# Patient Record
Sex: Female | Born: 1955 | Race: Black or African American | Hispanic: No | Marital: Married | Smoking: Light tobacco smoker
Health system: Southern US, Community
[De-identification: ages and names within clinical notes are randomized; demographics above are authoritative.]

## PROBLEM LIST (undated history)

## (undated) DIAGNOSIS — I4891 Unspecified atrial fibrillation: Secondary | ICD-10-CM

## (undated) DIAGNOSIS — N289 Disorder of kidney and ureter, unspecified: Secondary | ICD-10-CM

## (undated) DIAGNOSIS — I10 Essential (primary) hypertension: Secondary | ICD-10-CM

## (undated) DIAGNOSIS — I509 Heart failure, unspecified: Secondary | ICD-10-CM

## (undated) DIAGNOSIS — Z992 Dependence on renal dialysis: Secondary | ICD-10-CM

## (undated) DIAGNOSIS — I219 Acute myocardial infarction, unspecified: Secondary | ICD-10-CM

## (undated) DIAGNOSIS — I2510 Atherosclerotic heart disease of native coronary artery without angina pectoris: Secondary | ICD-10-CM

## (undated) HISTORY — PX: CHOLECYSTECTOMY: SHX55

---

## 2012-05-25 HISTORY — PX: ABDOMINAL AORTIC ANEURYSM REPAIR: SUR1152

## 2014-11-10 ENCOUNTER — Other Ambulatory Visit: Payer: Self-pay

## 2014-11-10 ENCOUNTER — Inpatient Hospital Stay: Payer: Medicare Other

## 2014-11-10 ENCOUNTER — Emergency Department: Payer: Medicare Other

## 2014-11-10 ENCOUNTER — Inpatient Hospital Stay
Admission: EM | Admit: 2014-11-10 | Discharge: 2014-11-23 | DRG: 853 | Disposition: E | Payer: Medicare Other | Attending: Internal Medicine | Admitting: Internal Medicine

## 2014-11-10 DIAGNOSIS — E162 Hypoglycemia, unspecified: Secondary | ICD-10-CM | POA: Diagnosis present

## 2014-11-10 DIAGNOSIS — Z955 Presence of coronary angioplasty implant and graft: Secondary | ICD-10-CM

## 2014-11-10 DIAGNOSIS — E872 Acidosis, unspecified: Secondary | ICD-10-CM

## 2014-11-10 DIAGNOSIS — D65 Disseminated intravascular coagulation [defibrination syndrome]: Secondary | ICD-10-CM | POA: Diagnosis present

## 2014-11-10 DIAGNOSIS — Z8679 Personal history of other diseases of the circulatory system: Secondary | ICD-10-CM

## 2014-11-10 DIAGNOSIS — I469 Cardiac arrest, cause unspecified: Secondary | ICD-10-CM | POA: Diagnosis present

## 2014-11-10 DIAGNOSIS — J9601 Acute respiratory failure with hypoxia: Secondary | ICD-10-CM | POA: Diagnosis present

## 2014-11-10 DIAGNOSIS — K5660 Unspecified intestinal obstruction: Secondary | ICD-10-CM | POA: Diagnosis present

## 2014-11-10 DIAGNOSIS — I4891 Unspecified atrial fibrillation: Secondary | ICD-10-CM | POA: Diagnosis present

## 2014-11-10 DIAGNOSIS — R748 Abnormal levels of other serum enzymes: Secondary | ICD-10-CM | POA: Diagnosis present

## 2014-11-10 DIAGNOSIS — G4733 Obstructive sleep apnea (adult) (pediatric): Secondary | ICD-10-CM | POA: Diagnosis present

## 2014-11-10 DIAGNOSIS — I252 Old myocardial infarction: Secondary | ICD-10-CM | POA: Diagnosis not present

## 2014-11-10 DIAGNOSIS — Y838 Other surgical procedures as the cause of abnormal reaction of the patient, or of later complication, without mention of misadventure at the time of the procedure: Secondary | ICD-10-CM | POA: Diagnosis present

## 2014-11-10 DIAGNOSIS — R109 Unspecified abdominal pain: Secondary | ICD-10-CM

## 2014-11-10 DIAGNOSIS — R778 Other specified abnormalities of plasma proteins: Secondary | ICD-10-CM

## 2014-11-10 DIAGNOSIS — N186 End stage renal disease: Secondary | ICD-10-CM | POA: Diagnosis not present

## 2014-11-10 DIAGNOSIS — Y658 Other specified misadventures during surgical and medical care: Secondary | ICD-10-CM | POA: Diagnosis present

## 2014-11-10 DIAGNOSIS — R001 Bradycardia, unspecified: Secondary | ICD-10-CM | POA: Diagnosis present

## 2014-11-10 DIAGNOSIS — Z79899 Other long term (current) drug therapy: Secondary | ICD-10-CM

## 2014-11-10 DIAGNOSIS — I9752 Accidental puncture and laceration of a circulatory system organ or structure during other procedure: Secondary | ICD-10-CM | POA: Diagnosis present

## 2014-11-10 DIAGNOSIS — Z9889 Other specified postprocedural states: Secondary | ICD-10-CM | POA: Diagnosis not present

## 2014-11-10 DIAGNOSIS — Z01818 Encounter for other preprocedural examination: Secondary | ICD-10-CM | POA: Diagnosis present

## 2014-11-10 DIAGNOSIS — Z9049 Acquired absence of other specified parts of digestive tract: Secondary | ICD-10-CM | POA: Diagnosis present

## 2014-11-10 DIAGNOSIS — I5031 Acute diastolic (congestive) heart failure: Secondary | ICD-10-CM | POA: Diagnosis present

## 2014-11-10 DIAGNOSIS — Z992 Dependence on renal dialysis: Secondary | ICD-10-CM

## 2014-11-10 DIAGNOSIS — T827XXA Infection and inflammatory reaction due to other cardiac and vascular devices, implants and grafts, initial encounter: Secondary | ICD-10-CM | POA: Diagnosis present

## 2014-11-10 DIAGNOSIS — I509 Heart failure, unspecified: Secondary | ICD-10-CM

## 2014-11-10 DIAGNOSIS — K219 Gastro-esophageal reflux disease without esophagitis: Secondary | ICD-10-CM | POA: Diagnosis present

## 2014-11-10 DIAGNOSIS — J9600 Acute respiratory failure, unspecified whether with hypoxia or hypercapnia: Secondary | ICD-10-CM

## 2014-11-10 DIAGNOSIS — R6521 Severe sepsis with septic shock: Secondary | ICD-10-CM | POA: Diagnosis present

## 2014-11-10 DIAGNOSIS — K921 Melena: Secondary | ICD-10-CM | POA: Diagnosis present

## 2014-11-10 DIAGNOSIS — S1093XA Contusion of unspecified part of neck, initial encounter: Secondary | ICD-10-CM | POA: Diagnosis present

## 2014-11-10 DIAGNOSIS — I120 Hypertensive chronic kidney disease with stage 5 chronic kidney disease or end stage renal disease: Secondary | ICD-10-CM | POA: Diagnosis present

## 2014-11-10 DIAGNOSIS — R7989 Other specified abnormal findings of blood chemistry: Secondary | ICD-10-CM

## 2014-11-10 DIAGNOSIS — Z86010 Personal history of colonic polyps: Secondary | ICD-10-CM

## 2014-11-10 DIAGNOSIS — Z9911 Dependence on respirator [ventilator] status: Secondary | ICD-10-CM

## 2014-11-10 DIAGNOSIS — A419 Sepsis, unspecified organism: Secondary | ICD-10-CM | POA: Diagnosis present

## 2014-11-10 DIAGNOSIS — F17210 Nicotine dependence, cigarettes, uncomplicated: Secondary | ICD-10-CM | POA: Diagnosis present

## 2014-11-10 DIAGNOSIS — N2581 Secondary hyperparathyroidism of renal origin: Secondary | ICD-10-CM | POA: Diagnosis present

## 2014-11-10 DIAGNOSIS — D689 Coagulation defect, unspecified: Secondary | ICD-10-CM | POA: Diagnosis not present

## 2014-11-10 DIAGNOSIS — Z8249 Family history of ischemic heart disease and other diseases of the circulatory system: Secondary | ICD-10-CM

## 2014-11-10 DIAGNOSIS — I2510 Atherosclerotic heart disease of native coronary artery without angina pectoris: Secondary | ICD-10-CM | POA: Diagnosis present

## 2014-11-10 DIAGNOSIS — R740 Nonspecific elevation of levels of transaminase and lactic acid dehydrogenase [LDH]: Secondary | ICD-10-CM | POA: Diagnosis present

## 2014-11-10 DIAGNOSIS — J9690 Respiratory failure, unspecified, unspecified whether with hypoxia or hypercapnia: Secondary | ICD-10-CM

## 2014-11-10 HISTORY — DX: Atherosclerotic heart disease of native coronary artery without angina pectoris: I25.10

## 2014-11-10 HISTORY — DX: Unspecified atrial fibrillation: I48.91

## 2014-11-10 HISTORY — DX: Disorder of kidney and ureter, unspecified: N28.9

## 2014-11-10 HISTORY — DX: Heart failure, unspecified: I50.9

## 2014-11-10 HISTORY — DX: Dependence on renal dialysis: Z99.2

## 2014-11-10 HISTORY — DX: Acute myocardial infarction, unspecified: I21.9

## 2014-11-10 HISTORY — DX: Essential (primary) hypertension: I10

## 2014-11-10 LAB — BASIC METABOLIC PANEL
Anion gap: 25 — ABNORMAL HIGH (ref 5–15)
BUN: 72 mg/dL — ABNORMAL HIGH (ref 6–20)
CO2: 13 mmol/L — ABNORMAL LOW (ref 22–32)
Calcium: 8.3 mg/dL — ABNORMAL LOW (ref 8.9–10.3)
Chloride: 96 mmol/L — ABNORMAL LOW (ref 101–111)
Creatinine, Ser: 6.62 mg/dL — ABNORMAL HIGH (ref 0.44–1.00)
GFR calc Af Amer: 7 mL/min — ABNORMAL LOW (ref >60)
GFR, EST NON AFRICAN AMERICAN: 6 mL/min — AB (ref >60)
Glucose, Bld: 213 mg/dL — ABNORMAL HIGH (ref 65–99)
Potassium: 5.9 mmol/L — ABNORMAL HIGH (ref 3.5–5.1)
Sodium: 134 mmol/L — ABNORMAL LOW (ref 135–145)

## 2014-11-10 LAB — CBC WITH DIFFERENTIAL/PLATELET
Band Neutrophils: 0 % (ref 0–10)
Basophils Absolute: 0 10*3/uL (ref 0.0–0.1)
Basophils Relative: 0 % (ref 0–1)
Blasts: 0 %
Eosinophils Absolute: 0 10*3/uL (ref 0.0–0.7)
Eosinophils Relative: 0 % (ref 0–5)
HCT: 39.5 % (ref 35.0–47.0)
Hemoglobin: 12.2 g/dL (ref 12.0–16.0)
Lymphocytes Relative: 5 % — ABNORMAL LOW (ref 12–46)
Lymphs Abs: 0.4 10*3/uL — ABNORMAL LOW (ref 0.7–4.0)
MCH: 29.5 pg (ref 26.0–34.0)
MCHC: 30.8 g/dL — AB (ref 32.0–36.0)
MCV: 95.8 fL (ref 80.0–100.0)
METAMYELOCYTES PCT: 0 %
MONOS PCT: 5 % (ref 3–12)
MYELOCYTES: 0 %
Monocytes Absolute: 0.4 10*3/uL (ref 0.1–1.0)
NEUTROS PCT: 90 % — AB (ref 43–77)
NRBC: 1 /100{WBCs} — AB
Neutro Abs: 7.5 10*3/uL (ref 1.7–7.7)
Other: 0 %
PLATELETS: 47 10*3/uL — AB (ref 150–400)
PROMYELOCYTES ABS: 0 %
RBC: 4.13 MIL/uL (ref 3.80–5.20)
RDW: 19.5 % — AB (ref 11.5–14.5)
WBC: 8.3 10*3/uL (ref 3.6–11.0)

## 2014-11-10 LAB — HEPATIC FUNCTION PANEL
ALK PHOS: 118 U/L (ref 38–126)
ALT: 61 U/L — ABNORMAL HIGH (ref 14–54)
AST: 145 U/L — AB (ref 15–41)
Albumin: 3.3 g/dL — ABNORMAL LOW (ref 3.5–5.0)
BILIRUBIN TOTAL: 4.2 mg/dL — AB (ref 0.3–1.2)
Bilirubin, Direct: 2.5 mg/dL — ABNORMAL HIGH (ref 0.1–0.5)
Indirect Bilirubin: 1.7 mg/dL — ABNORMAL HIGH (ref 0.3–0.9)
TOTAL PROTEIN: 6 g/dL — AB (ref 6.5–8.1)

## 2014-11-10 LAB — PROTIME-INR
INR: 2.45
Prothrombin Time: 26.7 seconds — ABNORMAL HIGH (ref 11.4–15.0)

## 2014-11-10 LAB — TROPONIN I
TROPONIN I: 0.54 ng/mL — AB (ref <0.031)
Troponin I: 3.31 ng/mL — ABNORMAL HIGH (ref <0.031)

## 2014-11-10 LAB — OCCULT BLOOD X 1 CARD TO LAB, STOOL: FECAL OCCULT BLD: POSITIVE — AB

## 2014-11-10 LAB — RENAL FUNCTION PANEL
Albumin: 2.8 g/dL — ABNORMAL LOW (ref 3.5–5.0)
Anion gap: 25 — ABNORMAL HIGH (ref 5–15)
BUN: 72 mg/dL — ABNORMAL HIGH (ref 6–20)
CALCIUM: 8.4 mg/dL — AB (ref 8.9–10.3)
CO2: 14 mmol/L — ABNORMAL LOW (ref 22–32)
Chloride: 94 mmol/L — ABNORMAL LOW (ref 101–111)
Creatinine, Ser: 6.61 mg/dL — ABNORMAL HIGH (ref 0.44–1.00)
GFR calc Af Amer: 7 mL/min — ABNORMAL LOW (ref >60)
GFR calc non Af Amer: 6 mL/min — ABNORMAL LOW (ref >60)
Glucose, Bld: 212 mg/dL — ABNORMAL HIGH (ref 65–99)
PHOSPHORUS: 12.5 mg/dL — AB (ref 2.5–4.6)
Potassium: 5.9 mmol/L — ABNORMAL HIGH (ref 3.5–5.1)
Sodium: 133 mmol/L — ABNORMAL LOW (ref 135–145)

## 2014-11-10 LAB — GLUCOSE, CAPILLARY
Glucose-Capillary: <10 mg/dL — CL (ref 65–99)
Glucose-Capillary: <10 mg/dL — CL (ref 65–99)
Glucose-Capillary: 101 mg/dL — ABNORMAL HIGH (ref 65–99)

## 2014-11-10 LAB — BRAIN NATRIURETIC PEPTIDE: B Natriuretic Peptide: >4500 pg/mL — ABNORMAL HIGH (ref 0.0–100.0)

## 2014-11-10 LAB — MRSA PCR SCREENING: MRSA BY PCR: NEGATIVE

## 2014-11-10 LAB — MAGNESIUM: Magnesium: 2.3 mg/dL (ref 1.7–2.4)

## 2014-11-10 LAB — APTT: aPTT: 38 seconds — ABNORMAL HIGH (ref 24–36)

## 2014-11-10 LAB — LACTIC ACID, PLASMA: Lactic Acid, Venous: 11.8 mmol/L (ref 0.5–2.0)

## 2014-11-10 MED ORDER — DEXMEDETOMIDINE HCL IN NACL 400 MCG/100ML IV SOLN: 0.4000 ug/kg/h | INTRAVENOUS | Status: DC

## 2014-11-10 MED ORDER — PANTOPRAZOLE SODIUM 40 MG IV SOLR
40.0000 mg | Freq: Two times a day (BID) | INTRAVENOUS | Status: DC
  Administered 2014-11-10: 40 mg via INTRAVENOUS
  Filled 2014-11-10: qty 40

## 2014-11-10 MED ORDER — LIDOCAINE HCL (PF) 1 % IJ SOLN
INTRAMUSCULAR | Status: AC
  Administered 2014-11-10: 5 mL
  Filled 2014-11-10: qty 5

## 2014-11-10 MED ORDER — NOREPINEPHRINE BITARTRATE 1 MG/ML IV SOLN
16.0000 ug/min | Freq: Once | INTRAVENOUS | Status: AC
  Administered 2014-11-10: 30 ug/min via INTRAVENOUS
  Filled 2014-11-10: qty 16

## 2014-11-10 MED ORDER — SODIUM CHLORIDE 0.9 % IV SOLN: Freq: Once | INTRAVENOUS | Status: DC

## 2014-11-10 MED ORDER — SODIUM BICARBONATE 8.4 % IV SOLN
50.0000 meq | Freq: Once | INTRAVENOUS | Status: AC
  Administered 2014-11-10: 50 meq via INTRAVENOUS
  Filled 2014-11-10: qty 50

## 2014-11-10 MED ORDER — DILTIAZEM HCL 25 MG/5ML IV SOLN
INTRAVENOUS | Status: AC
  Administered 2014-11-10: 5 mg
  Filled 2014-11-10: qty 5

## 2014-11-10 MED ORDER — HYDROCORTISONE SOD SUCCINATE 100 MG PF FOR IT USE: 50.0000 mg | Freq: Four times a day (QID) | INTRAMUSCULAR | Status: DC

## 2014-11-10 MED ORDER — PUREFLOW DIALYSIS SOLUTION: INTRAVENOUS | Status: DC

## 2014-11-10 MED ORDER — SODIUM BICARBONATE 4.2 % IV SOLN
50.0000 meq | INTRAVENOUS | Status: DC
  Filled 2014-11-10: qty 100

## 2014-11-10 MED ORDER — VANCOMYCIN HCL IN DEXTROSE 1-5 GM/200ML-% IV SOLN
1000.0000 mg | INTRAVENOUS | Status: DC
  Filled 2014-11-10: qty 200

## 2014-11-10 MED ORDER — IOHEXOL 240 MG/ML SOLN: 25.0000 mL | INTRAMUSCULAR | Status: DC

## 2014-11-10 MED ORDER — VANCOMYCIN HCL IN DEXTROSE 1-5 GM/200ML-% IV SOLN
1000.0000 mg | Freq: Once | INTRAVENOUS | Status: AC
  Administered 2014-11-10: 1000 mg via INTRAVENOUS

## 2014-11-10 MED ORDER — DILTIAZEM HCL 25 MG/5ML IV SOLN: 5.0000 mg | Freq: Once | INTRAVENOUS | Status: DC

## 2014-11-10 MED ORDER — ONDANSETRON HCL 4 MG/2ML IJ SOLN
4.0000 mg | Freq: Once | INTRAMUSCULAR | Status: AC
Start: 2014-11-10 — End: 2014-11-10
  Administered 2014-11-10: 4 mg via INTRAVENOUS

## 2014-11-10 MED ORDER — HEPARIN SODIUM (PORCINE) 1000 UNIT/ML DIALYSIS
1000.0000 [IU] | INTRAMUSCULAR | Status: DC | PRN
  Filled 2014-11-10: qty 6

## 2014-11-10 MED ORDER — VECURONIUM BROMIDE 10 MG IV SOLR
INTRAVENOUS | Status: DC
Start: 2014-11-10 — End: 2014-11-10
  Filled 2014-11-10: qty 10

## 2014-11-10 MED ORDER — MIDAZOLAM HCL 2 MG/2ML IJ SOLN
2.0000 mg | Freq: Once | INTRAMUSCULAR | Status: AC
  Administered 2014-11-10: 2 mg via INTRAVENOUS

## 2014-11-10 MED ORDER — MIDAZOLAM HCL 2 MG/2ML IJ SOLN
INTRAMUSCULAR | Status: AC
  Administered 2014-11-10: 2 mg via INTRAVENOUS
  Filled 2014-11-10: qty 2

## 2014-11-10 MED ORDER — ONDANSETRON HCL 4 MG/2ML IJ SOLN
INTRAMUSCULAR | Status: AC
  Filled 2014-11-10: qty 2

## 2014-11-10 MED ORDER — MIDAZOLAM HCL 5 MG/5ML IJ SOLN
INTRAMUSCULAR | Status: AC
  Filled 2014-11-10: qty 5

## 2014-11-10 MED ORDER — PIPERACILLIN-TAZOBACTAM 3.375 G IVPB: 3.3750 g | Freq: Once | INTRAVENOUS | Status: DC

## 2014-11-10 MED ORDER — NOREPINEPHRINE BITARTRATE 1 MG/ML IV SOLN
8.0000 ug/min | Freq: Once | INTRAVENOUS | Status: AC
  Administered 2014-11-10: 2 ug/min via INTRAVENOUS
  Filled 2014-11-10: qty 4

## 2014-11-10 MED ORDER — SODIUM CHLORIDE 0.9 % IV SOLN: Freq: Once | INTRAVENOUS | Status: AC

## 2014-11-10 MED ORDER — MIDAZOLAM HCL 2 MG/2ML IJ SOLN
INTRAMUSCULAR | Status: AC
  Administered 2014-11-10: 2 mg
  Filled 2014-11-10: qty 2

## 2014-11-10 MED ORDER — DOPAMINE-DEXTROSE 3.2-5 MG/ML-% IV SOLN
10.0000 ug/kg/min | Freq: Once | INTRAVENOUS | Status: AC
  Administered 2014-11-10: 5 ug/kg/min via INTRAVENOUS

## 2014-11-10 MED ORDER — DEXTROSE 250 MG/ML IV SOLN
25.0000 mg | Freq: Once | INTRAVENOUS | Status: AC
  Administered 2014-11-10: 25 mg via INTRAVENOUS
  Filled 2014-11-10: qty 10

## 2014-11-10 MED ORDER — NOREPINEPHRINE 4 MG/250ML-% IV SOLN
INTRAVENOUS | Status: AC
  Administered 2014-11-10: 4 mg via INTRAVENOUS
  Filled 2014-11-10: qty 250

## 2014-11-10 MED ORDER — DEXTROSE 250 MG/ML IV SOLN
25.0000 mg | Freq: Once | INTRAVENOUS | Status: DC
  Filled 2014-11-10: qty 10

## 2014-11-10 MED ORDER — PIPERACILLIN-TAZOBACTAM 3.375 G IVPB
3.3750 g | Freq: Three times a day (TID) | INTRAVENOUS | Status: DC
  Administered 2014-11-10: 3.375 g via INTRAVENOUS
  Filled 2014-11-10 (×4): qty 50

## 2014-11-10 MED ORDER — SODIUM BICARBONATE 4.2 % IV SOLN: 50.0000 meq | INTRAVENOUS | Status: DC

## 2014-11-10 MED ORDER — NOREPINEPHRINE BITARTRATE 1 MG/ML IV SOLN: 16.0000 ug/min | INTRAVENOUS | Status: DC

## 2014-11-10 MED ORDER — FENTANYL 2500MCG IN NS 250ML (10MCG/ML) PREMIX INFUSION
10.0000 ug/h | INTRAVENOUS | Status: DC
Start: 2014-11-10 — End: 2014-11-10
  Filled 2014-11-10: qty 250

## 2014-11-10 MED ORDER — SODIUM BICARBONATE 8.4 % IV SOLN
50.0000 meq | Freq: Once | INTRAVENOUS | Status: AC
  Administered 2014-11-10: 50 meq via INTRAVENOUS
  Filled 2014-11-10 (×2): qty 50

## 2014-11-10 MED ORDER — DEXTROSE 50 % IV SOLN
INTRAVENOUS | Status: AC
  Administered 2014-11-10: 50 mL
  Filled 2014-11-10: qty 100

## 2014-11-10 MED ORDER — VASOPRESSIN 20 UNIT/ML IV SOLN
0.0300 [IU]/min | INTRAVENOUS | Status: DC
  Administered 2014-11-10: 0.03 [IU]/min via INTRAVENOUS
  Filled 2014-11-10: qty 2

## 2014-11-10 MED ORDER — ETOMIDATE 2 MG/ML IV SOLN
INTRAVENOUS | Status: AC | PRN
  Administered 2014-11-10: 20 mg via INTRAVENOUS

## 2014-11-10 MED ORDER — MIDAZOLAM HCL 5 MG/5ML IJ SOLN
INTRAMUSCULAR | Status: AC | PRN
  Administered 2014-11-10: 2 mg via INTRAVENOUS

## 2014-11-10 MED ORDER — HYDROCORTISONE NA SUCCINATE PF 100 MG IJ SOLR
50.0000 mg | Freq: Four times a day (QID) | INTRAMUSCULAR | Status: DC
  Administered 2014-11-10: 50 mg via INTRAVENOUS
  Filled 2014-11-10: qty 2

## 2014-11-10 MED ORDER — VECURONIUM BROMIDE 10 MG IV SOLR
10.0000 mg | INTRAVENOUS | Status: DC | PRN
  Administered 2014-11-10: 10 mg via INTRAVENOUS

## 2014-11-10 MED ORDER — VANCOMYCIN HCL IN DEXTROSE 1-5 GM/200ML-% IV SOLN
INTRAVENOUS | Status: AC
  Filled 2014-11-10: qty 200

## 2014-11-10 MED ORDER — DOPAMINE-DEXTROSE 3.2-5 MG/ML-% IV SOLN
INTRAVENOUS | Status: AC
  Filled 2014-11-10: qty 250

## 2014-11-10 MED ORDER — DILTIAZEM LOAD VIA INFUSION: 5.0000 mg | Freq: Once | INTRAVENOUS | Status: AC

## 2014-11-10 MED ORDER — SUCCINYLCHOLINE CHLORIDE 20 MG/ML IJ SOLN
INTRAMUSCULAR | Status: AC | PRN
  Administered 2014-11-10: 100 mg via INTRAVENOUS

## 2014-11-10 MED ORDER — SODIUM CHLORIDE 0.9 % IV SOLN
8.0000 mg/h | INTRAVENOUS | Status: DC
  Filled 2014-11-10: qty 80

## 2014-11-11 LAB — HCV COMMENT:

## 2014-11-11 LAB — HEPATITIS B SURFACE ANTIBODY,QUALITATIVE: Hep B S Ab: REACTIVE

## 2014-11-11 LAB — HEPATITIS B SURFACE ANTIGEN: HEP B S AG: NEGATIVE

## 2014-11-11 LAB — HEPATITIS C ANTIBODY (REFLEX): HCV AB: 0.1 {s_co_ratio} (ref 0.0–0.9)

## 2014-11-12 MED FILL — Medication: Qty: 1 | Status: AC

## 2014-11-13 LAB — BLOOD GAS, ARTERIAL
ACID-BASE DEFICIT: 21.9 mmol/L — AB (ref 0.0–2.0)
Allens test (pass/fail): POSITIVE — AB
Bicarbonate: 8.6 mEq/L — ABNORMAL LOW (ref 21.0–28.0)
FIO2: 1 %
FIO2: 1 %
O2 Saturation: 94 %
PATIENT TEMPERATURE: 37
PCO2 ART: 71 mmHg — AB (ref 32.0–48.0)
PEEP/CPAP: 5 cmH2O
PEEP/CPAP: 5 cmH2O
PH ART: 7 — AB (ref 7.350–7.450)
PH ART: 6.81 — AB (ref 7.350–7.450)
PO2 ART: 105 mmHg (ref 83.0–108.0)
PO2 ART: 45 mmHg — AB (ref 83.0–108.0)
PRESSURE CONTROL: 25 cmH2O
Patient temperature: 37
RATE: 16 resp/min
RATE: 16 resp/min
VT: 500 mL
pCO2 arterial: 35 mmHg (ref 32.0–48.0)

## 2014-11-16 LAB — CULTURE, BLOOD (ROUTINE X 2)
Culture: NO GROWTH
Culture: NO GROWTH

## 2014-11-23 NOTE — ED Notes (Signed)
Dr Clent Ridges in to eval. To be admitted to ICU

## 2014-11-23 NOTE — ED Notes (Signed)
Dr Thedore Mins in to eval

## 2014-11-23 NOTE — H&P (Addendum)
[facility] [place] at [facility]   PATIENT NAME: Gina Sandoval    MR#:  [id number]  DATE OF BIRTH:  [birth year]  DATE OF ADMISSION:  2014/11/14  PRIMARY CARE PHYSICIAN: Pcp Not In System   REQUESTING/REFERRING PHYSICIAN:  Dr Mayford Knife  CHIEF COMPLAINT:   Chief Complaint  Patient presents with  . Nausea    HISTORY OF PRESENT ILLNESS:  Gina Sandoval  is a 59 y.o. female with a known history of coronary artery disease status post MI with stents in place, atrial fibrillation, end-stage renal disease on hemodialysis who presents with 2 weeks of nausea and vomiting, abdominal pain, bloody stools. She is from out of town lives in [place]. She was evaluated at [facility] yesterday for kidney transplant and was not found to be a transplant candidate due to multiple comorbidities. She missed hemodialysis today and was feeling poorly so was brought to the emergency room by her family. On presentation she is found to be hypotensive initial blood pressure 74/46, tachycardic at 135, dyspneic with oxygen saturations in the 50% on room air. The PermCath in her left chest seems to be infected. She is being intubated in the emergency room and admitted to the ICU for severe sepsis.  PAST MEDICAL HISTORY:   Past Medical History  Diagnosis Date  . Renal disorder   . Hemodialysis patient   . A-fib   . CHF (congestive heart failure)   . Coronary artery disease   . Myocardial infarction   . Hypertension     PAST SURGICAL HISTORY:   Past Surgical History  Procedure Laterality Date  . Cholecystectomy    . Abdominal aortic aneurysm repair  2014    SOCIAL HISTORY:   History  Substance Use Topics  . Smoking status: Light Tobacco Smoker  . Smokeless tobacco: Not on file  . Alcohol Use: No    FAMILY HISTORY:   Family History  Problem Relation Age of Onset  . CAD Mother   . CAD Brother     DRUG ALLERGIES:  Not on File  REVIEW OF SYSTEMS:   ROS   unable to obtain due to patient altered mental status and critical illness  MEDICATIONS AT HOME:   Prior to Admission medications   Not on File      VITAL SIGNS:  Blood pressure 57/48, pulse 64, resp. rate 26, height 5\' 9"  (1.753 m), weight 86.183 kg (190 lb), SpO2 96 %.  PHYSICAL EXAMINATION:  GENERAL:  59 y.o.-year-old patient lying in the bed, appears critically ill, communicating in short sentences, short of breath and lethargic EYES: Pupils equal, round, reactive to light and accommodation. Positive scleral icterus. Extraocular muscles intact.  HEENT: Head atraumatic, normocephalic. Oropharynx and nasopharynx clear. His membranes are moist, good dentition NECK:  Supple, there is a large hematoma over the right neck. No thyroid enlargement, no tenderness.  LUNGS: Short shallow respirations, diffuse crackles and wheezes, respiratory distress, poor air movement  CARDIOVASCULAR: S1, S2 normal. No murmurs, rubs, or gallops. Tachycardiac, irregular ABDOMEN: Firm, distended, tender to palpation, bowel sounds decreased, guarding, wincing with examination EXTREMITIES: 2+ pitting edema to above the knees bilaterally, blistering of the skin, feet are cool, pulses decreased NEUROLOGIC: Unable to do full examination due to altered mental status. Cranial nerves seem to be intact, no focal defects, able to move all extremities spontaneously  PSYCHIATRIC: Lethargic SKIN: Large hematoma over the right neck, blistering over the lower extremities with some skin sloughing and oozing of clear material  LABORATORY PANEL:   CBC  Recent Labs Lab 11-25-14 0904  WBC 8.3  HGB 12.2  HCT 39.5  PLT 47*   ------------------------------------------------------------------------------------------------------------------  Chemistries  No results for input(s): NA, K, CL, CO2, GLUCOSE, BUN, CREATININE, CALCIUM, MG, AST, ALT, ALKPHOS, BILITOT in the last 168 hours.  Invalid input(s):  GFRCGP ------------------------------------------------------------------------------------------------------------------  Cardiac Enzymes  Recent Labs Lab 11/25/2014 0904  TROPONINI 0.54*   ------------------------------------------------------------------------------------------------------------------  RADIOLOGY:  Dg Chest 1 View  11/25/2014   CLINICAL DATA:  Unsuccessful central line placement.  EXAM: CHEST  1 VIEW  COMPARISON:  Same day.  FINDINGS: Moderate cardiomegaly is noted. Left-sided dialysis catheter is noted with distal tip in the expected position of the SVC. Mild central pulmonary vascular congestion is noted. No pneumothorax or pleural effusion is noted. Bony thorax is intact.  IMPRESSION: Moderate cardiomegaly with mild central pulmonary vascular congestion.   Electronically Signed   By: Lupita Raider, M.D.   On: 11/25/14 12:04    EKG:   Orders placed or performed during the hospital encounter of November 25, 2014  . ED EKG  . ED EKG  . ED EKG  . ED EKG    IMPRESSION AND PLAN:   Active Problems:   Sepsis   ESRD (end stage renal disease) on dialysis   Atrial fibrillation with RVR   Coagulopathy   CAD (coronary artery disease)   CHF (congestive heart failure)  #1 severe sepsis likely due to infected PermCath in the left chest: Blood cultures pending. Vascular surgery, Dr. Gilda Crease Will remove the PermCath and send for culture. For now we'll cover with broad-spectrum anti-biotics, vancomycin and Zosyn. Support blood pressure with levo fed adding dopamine at this time due to continued low blood pressure on levo fed. She has been intubated due to respiratory failure. ABG ordered stat, pending. Lactic acid is 11.  #2 atrial fibrillation with rapid ventricular response: Pressor agents have increased her heart rate despite initial control with Cardizem. Have consulted cardiology for assistance. I do not know if she has chronic atrial fibrillation. Med reconciliation still  pending.  #3 end-stage renal disease on dialysis: For some reason there is no BMP at the time of my examination. I have ordered this stat. Nephrology is on board and plan is for CRRT once she is admitted to the ICU and temporary accesses placed.  #4 coagulopathy: I suspect she is in DIC due to sepsis. There are schistocytes present on peripheral smear, INR is 2.5, platelets decreased. Will hold off on pharmacological DVT prophylaxis due to high risk of bleeding. Treat sepsis  #5 abdominal pain and bloody stools: The hemoglobin is 12.2. No bleeding noted. Will guaiac stools. She also has transaminitis and elevated bili. Will obtain CT abdomen pelvis. Follow liver enzymes.  #6 congestive heart failure: Ejection fraction is unknown. BNP very elevated. Pulmonary edema on chest x-ray. Will obtain 2-D echocardiogram. Cardiology consultation ordered.  #7 coronary artery disease status post PCI with elevated troponins: We'll continue to trend troponins. Echo ordered. Unable to anticoagulate in the setting of potential DIC. Cardiology to follow. Most likely the elevated troponins are due to ischemic strain and renal failure.  #8 acute respiratory failure: Intubated in the emergency room due to increased work of breathing, hypoxia. ABG ordered at the time of my examination, results are pending. Respiratory failure likely due to sepsis, also potentially due to CHF exacerbation. Pulmonary care consultation is ordered. I have discussed the patient with Dr. Dema Severin.  #9 right neck hematoma: Unfortunately, on initial  attempts to place central line the carotid artery was punctured and she now has a large hematoma over the right neck. So far stable. Continue to monitor closely especially in the setting of coagulopathy. Intubating currently.  #10 goals of care: Initially the patient stated that she would not want to be intubated. She later changed her mind. I discussed her CODE STATUS and goals of care with her husband  and daughter. They stated that they would like her to be a full code and to pursue aggressive treatment in the ICU. They do understand that her condition is critical.   All the records are reviewed and case discussed with ED provider. Management plans discussed with the patient, family and they are in agreement.  CODE STATUS: Full  TOTAL TIME TAKING CARE OF THIS PATIENT: 65 minutes critical care time  Greater than 50% of this time was spent in counseling and coordination of care. Discussed this case with Dr. Dema Severin, Dr. Thedore Mins, the patient's family, emergency room physician.   Elby Showers M.D on Dec 03, 2014 at 12:07 PM  Between 7am to 6pm - Pager - [phone]  After 6pm go to [email or web address] - password [email or web address]  [facility]  Office  [phone]  CC: Primary care physician; Pcp Not In System

## 2014-11-23 NOTE — Op Note (Addendum)
OPERATIVE NOTE   PROCEDURE: 1. Insertion of triple-lumen central venous catheter left femoral approach.  PRE-OPERATIVE DIAGNOSIS: catheter related sepsis, septic shock, respiratory failure  POST-OPERATIVE DIAGNOSIS: same  SURGEON: Renford Dills M.D.  ANESTHESIA: 1% lidocaine local infiltration  ESTIMATED BLOOD LOSS: Minimal cc  INDICATIONS:   Gina Sandoval is a 59 y.o. female who presents with signs and symptoms of sepsis.  In the ER she deteriorated dramatically and was intubated.  She is hypotensive and unresponsive.  DESCRIPTION: After obtaining full informed written consent, the patient was positioned supine. The left groin was prepped and draped in a sterile fashion. Ultrasound was placed in a sterile sleeve. Ultrasound was utilized to identify the left femoral vein which is noted to be echolucent and compressible indicating patency. Images recorded for the permanent record. Under real-time visualization a Seldinger needle is inserted into the vein and the guidewires advanced without difficulty. Small counterincision was made at the wire insertion site. Dilators passed over the wire and the triple-lumen catheter is fed without difficulty.  All 3 lm aspirate and flush easily and are packed with heparin saline. Catheter secured to the skin of the thigh with 2-0 silk. A sterile dressing is applied with Biopatch.  COMPLICATIONS: none  CONDITION: hemodynamics unchanged   Renford Dills, M.D. [place] [facility]. Office:  [phone]   11-28-14, 3:26 PM

## 2014-11-23 NOTE — ED Notes (Signed)
Permacath accessed by [facility] nurse, Marquita Palms

## 2014-11-23 NOTE — Progress Notes (Signed)
   12/04/2014 1645  Clinical Encounter Type  Visited With Family  Visit Type Death  Referral From Nurse  Consult/Referral To Chaplain  Spiritual Encounters  Spiritual Needs Emotional;Grief support  Chaplin provided compassionate presence to family.  Gina Sandoval [phone]

## 2014-11-23 NOTE — Progress Notes (Signed)
Dr. Clent Ridges and nursing supervisor Camila Li, RN both aware of patient's time of death. CDS notified of patient's death by Florentina Addison, RN.

## 2014-11-23 NOTE — Progress Notes (Addendum)
ANTIBIOTIC CONSULT NOTE - INITIAL  Pharmacy Consult for zosyn and vancomycin dosing, CRRT medication adjustment Indication: sepsis  Patient Measurements: Height: 5\' 9"  (175.3 cm) Weight: 190 lb (86.183 kg) IBW/kg (Calculated) : 66.2 Adjusted Body Weight:   Vital Signs: Temp: 97.5 F (36.4 C) (06/18 1230) Temp Source: Oral (06/18 1230) BP: 62/30 mmHg (06/18 1249) Pulse Rate: 142 (06/18 1238) Intake/Output from previous day:   Intake/Output from this shift:    Labs:  Recent Labs  2014/12/01 0904  WBC 8.3  HGB 12.2  PLT 47*   CrCl cannot be calculated (Patient has no serum creatinine result on file.). No results for input(s): VANCOTROUGH, VANCOPEAK, VANCORANDOM, GENTTROUGH, GENTPEAK, GENTRANDOM, TOBRATROUGH, TOBRAPEAK, TOBRARND, AMIKACINPEAK, AMIKACINTROU, AMIKACIN in the last 72 hours.   Microbiology: No results found for this or any previous visit (from the past 720 hour(s)).  Medical History: Past Medical History  Diagnosis Date  . Renal disorder   . Hemodialysis patient   . A-fib   . CHF (congestive heart failure)   . Coronary artery disease   . Myocardial infarction   . Hypertension     Medications:  Scheduled:  . dextrose  25 mg Intravenous Once  . pantoprazole (PROTONIX) IV  40 mg Intravenous Q12H  . piperacillin-tazobactam (ZOSYN)  IV  3.375 g Intravenous 3 times per day   Infusions:  . dexmedetomidine    . fentaNYL infusion INTRAVENOUS    . norepinephrine (LEVOPHED) Adult infusion    . vasopressin (PITRESSIN) infusion - *FOR SHOCK* 0.03 Units/min (12/01/14 1338)   Anti-infectives    Start     Dose/Rate Route Frequency Ordered Stop   12/01/2014 1430  piperacillin-tazobactam (ZOSYN) IVPB 3.375 g     3.375 g 12.5 mL/hr over 240 Minutes Intravenous 3 times per day 12/01/2014 1425     12-01-14 1149  vancomycin (VANCOCIN) 1 GM/200ML IVPB    Comments:  DUGGINS, DANA: cabinet override      Dec 01, 2014 1149 Dec 01, 2014 1154   2014/12/01 1000  vancomycin (VANCOCIN)  IVPB 1000 mg/200 mL premix     1,000 mg 200 mL/hr over 60 Minutes Intravenous  Once 12/01/2014 0953 12/01/14 1252   12-01-2014 1000  piperacillin-tazobactam (ZOSYN) IVPB 3.375 g  Status:  Discontinued     3.375 g 12.5 mL/hr over 240 Minutes Intravenous  Once 12/01/14 0953 12-01-14 1425     Assessment: Pharmacy consulted to dose zosyn and vancomycin in this 59yo F being treated for septic shock.  Goal of Therapy:  Vancomycin trough level 15-20 mcg/ml  Plan:  Zosyn 3.375gm IV Q8hrs ordered as EI. SCr pending, however per renal note, the patient will be started on CRRT. Will dose vancomycin 1gm IV Q24hrs to start 6/19. Patient received 1gm on 6/18 already. Trough ordered for 6/21 at 1100hrs which will be after 3 days of CRRT therapy and at steadystate. No other medications in need of adjustment for CRRT at this time. Pharmacy will continue to follow levels, renal function, and culture results.   [address] [place], [place] D., BCPS 12-01-14

## 2014-11-23 NOTE — Progress Notes (Signed)
Patient began bradying down on cardiac monitor while Dr. Gilda Crease was in room(MD had just removed old permacath and was holding pressure.) RN unable to palpate pulse therefore code blue called at 1557, chest compressions immediately started and Dr. Dema Severin in room at time code was called. See code blue sheet. Pulse and cardiac rhythm regained at 1605. Dr. Clent Ridges and Dr. Dema Severin speaking with family.  Netha Dafoe B

## 2014-11-23 NOTE — Progress Notes (Signed)
Patient arrived to ICU and Annabelle Harman, RN from ER gave report to this RN at bedside Dr. Dema Severin present and assessed patient gave order for vasopressin drip and to stop dopamine and to change levo drip to quadruple concentration. MD also gave order for 2 amps of d50. Dr. Gilda Crease aware and coming to ICU to place central line, arterial line and temporary dialysis catheter. . Aashvi Rezabek B

## 2014-11-23 NOTE — ED Notes (Signed)
Transport to ICU with 2 RN and RT on moniter, vent and high dose pressers. Report to [facility].  Dr Garald Braver at bedside.  Updated on pt condition

## 2014-11-23 NOTE — Progress Notes (Signed)
Code Blue note: Patient noted to have CODE BLUE event. Patient noted to be bradycardia down to the 30s with loss of pulse, CODE BLUE started at 1557. Code [id number]. ACLS protocol. Epi 3, atropine 1, bicarbonate 2 Amps given, before ROSC.  Stephanie Acre, MD  Pulmonary and Critical Care Pager [phone] (Please enter 7-digits)

## 2014-11-23 NOTE — Progress Notes (Signed)
   11-23-14 1600  Clinical Encounter Type  Visited With Family;Patient  Visit Type Code  Consult/Referral To Chaplain  Spiritual Encounters  Spiritual Needs Prayer;Grief support  Stress Factors  Family Stress Factors Major life changes  Chaplain arrived at code to provide support to staff and was a calming presence for family during consultation with doctor.

## 2014-11-23 NOTE — Discharge Summary (Signed)
[place] [facility] [place] NAME: Gina Sandoval    MR#:  [id number]  DATE OF BIRTH:  22-Jan-1956  DATE OF ADMISSION:  11-26-2014 ADMITTING PHYSICIAN: Aldean Jewett, MD  DATE OF DISCHARGE: November 26, 2014  PRIMARY CARE PHYSICIAN: Pcp Not In System    ADMISSION DIAGNOSIS:  Encounter for intubation [Z01.818] Abdominal pain [R10.9] Sepsis, due to unspecified organism [A41.9]  DISCHARGE DIAGNOSIS:  Active Problems:   Sepsis   ESRD (end stage renal disease) on dialysis   Atrial fibrillation with RVR   Coagulopathy   CAD (coronary artery disease)   CHF (congestive heart failure)   Metabolic acidosis   Lactic acidosis   Respiratory failure, acute   Elevated troponin   SECONDARY DIAGNOSIS:   Past Medical History  Diagnosis Date  . Renal disorder   . Hemodialysis patient   . A-fib   . CHF (congestive heart failure)   . Coronary artery disease   . Myocardial infarction   . Hypertension     HOSPITAL COURSE:   Gina Sandoval is a 59 y.o. female with a known history of coronary artery disease status post MI with stents in place, congestive heart failure with severely decreased systolic ejection fraction, atrial fibrillation, end-stage renal disease on hemodialysis who presented this morning after 2 weeks of nausea and vomiting, abdominal pain, bloody stools. On presentation she was hypotensive, tachycardic and hypoxic. She was thought to be septic due to infected hemodialysis catheter. Abdominal imaging was planned to further investigate the abdominal pain and GI bleeding. She had a severe metabolic acidosis with lactate of 11 and pH of 7. She was started on pressor agents, intubated in the emergency room and admitted to the ICU. A left femoral line was placed by vascular surgery. CRRT was planned, but she was too unstable. She had a CODE BLUE event with bradycardia to PEA, see note by Dr. Stevenson Clinch for further details. She did  have return of circulation after that event. Dr. Stevenson Clinch met with the family and chaplain and decision was made to continue current care but not to perform any further resuscitation. She passed at 1643 with PEA. I have met with the family who are comfortable with their decision and appreciative of our care today. Chaplain offering support.  CONSULTS OBTAINED:  Treatment Team:  Murlean Iba, MD Florene Glen, MD  Dr. Dorris Carnes, cardiology Dr. Rica Koyanagi, vascular surgery Dr. Vilinda Boehringer, pulmonology critical care  DATA REVIEW:   CBC  Recent Labs Lab 2014-11-26 0904  WBC 8.3  HGB 12.2  HCT 39.5  PLT 47*    Chemistries   Recent Labs Lab 2014/11/26 0904 Nov 26, 2014 1455  NA  --  134*  133*  K  --  5.9*  5.9*  CL  --  96*  94*  CO2  --  13*  14*  GLUCOSE  --  213*  212*  BUN  --  72*  72*  CREATININE  --  6.62*  6.61*  CALCIUM  --  8.3*  8.4*  MG  --  2.3  AST 145*  --   ALT 61*  --   ALKPHOS 118  --   BILITOT 4.2*  --     Cardiac Enzymes  Recent Labs Lab Nov 26, 2014 1455  TROPONINI 3.31*    Microbiology Results  Results for orders placed or performed during the hospital encounter of 26-Nov-2014  MRSA PCR Screening     Status: None   Collection  Time: Nov 27, 2014  3:34 PM  Result Value Ref Range Status   MRSA by PCR NEGATIVE NEGATIVE Final    Comment:        The GeneXpert MRSA Assay (FDA approved for NASAL specimens only), is one component of a comprehensive MRSA colonization surveillance program. It is not intended to diagnose MRSA infection nor to guide or monitor treatment for MRSA infections.     RADIOLOGY:  Dg Chest 1 View  Nov 27, 2014   CLINICAL DATA:  Post intubation  EXAM: CHEST  1 VIEW  COMPARISON:  27-Nov-2014  FINDINGS: Endotracheal tube is appropriately positioned. Left IJ approach hemodialysis catheter terminates over the mid SVC. Marked enlargement of the cardiomediastinal silhouette is noted with increase in diffuse perihilar  ground-glass airspace opacities superimposed on interstitial edema. Small pleural effusions are noted. Pacing pad in place.  IMPRESSION: Endotracheal tube appropriately positioned.  Increased perihilar airspace opacities with a pattern most typical for alveolar edema superimposed on interstitial edema.  Marked cardiomegaly.   Electronically Signed   By: Conchita Paris M.D.   On: 11-27-14 13:05   Dg Chest 1 View  11-27-2014   CLINICAL DATA:  Unsuccessful central line placement.  EXAM: CHEST  1 VIEW  COMPARISON:  Same day.  FINDINGS: Moderate cardiomegaly is noted. Left-sided dialysis catheter is noted with distal tip in the expected position of the SVC. Mild central pulmonary vascular congestion is noted. No pneumothorax or pleural effusion is noted. Bony thorax is intact.  IMPRESSION: Moderate cardiomegaly with mild central pulmonary vascular congestion.   Electronically Signed   By: Marijo Conception, M.D.   On: 11/27/2014 12:04   Dg Abd 1 View  11/27/14   CLINICAL DATA:  Orogastric tube placement  EXAM: ABDOMEN - 1 VIEW  COMPARISON:  None.  FINDINGS: Orogastric tube tip and side port are in the stomach. There is gastric distention. There is no small bowel are large bowel dilatation. No air-fluid level. No free air. There are multiple surgical clips in the abdomen. There is atherosclerotic change in the aorta.  IMPRESSION: Orogastric tube tip and side port in stomach. Stomach is distended. No smaller large bowel dilatation.   Electronically Signed   By: Lowella Grip III M.D.   On: Nov 27, 2014 13:54   Dg Chest Port 1 View  2014/11/27   CLINICAL DATA:  Hypotension  EXAM: PORTABLE CHEST - 1 VIEW  COMPARISON:  None.  FINDINGS: Severe cardiomegaly. Left internal jugular dialysis catheter tip in the upper SVC. Hazy pulmonary edema throughout both central lungs. No pneumothorax.  IMPRESSION: CHF with central edema.   Electronically Signed   By: Marybelle Killings M.D.   On: 2014-11-27 13:19    EKG:   Orders  placed or performed during the hospital encounter of 11/27/14  . ED EKG  . ED EKG  . ED EKG  . ED EKG  . EKG 12-Lead  . EKG 12-Lead      Management plans discussed with the patient, family and they are in agreement.  CODE STATUS:     Code Status Orders        Start     Ordered   11-27-14 1627  Limited resuscitation (code)   Continuous    Question Answer Comment  In the event of cardiac or respiratory ARREST: Initiate Code Blue, Call Rapid Response No   In the event of cardiac or respiratory ARREST: Perform CPR No   In the event of cardiac or respiratory ARREST: Perform Intubation/Mechanical Ventilation Yes  In the event of cardiac or respiratory ARREST: Use NIPPV/BiPAp only if indicated No   In the event of cardiac or respiratory ARREST: Administer ACLS medications if indicated No   In the event of cardiac or respiratory ARREST: Perform Defibrillation or Cardioversion if indicated No      26-Nov-2014 1626      TOTAL TIME TAKING CARE OF THIS PATIENT: 35 minutes.    Myrtis Ser M.D on 2014/11/26 at 5:51 PM  Between 7am to 6pm - Pager - [phone]  After 6pm go to [email or web address] - password [email or web address] [facility]  Office  [phone]  CC: Primary care physician; Pcp Not In System

## 2014-11-23 NOTE — Progress Notes (Signed)
Death Notice  Date of Death - 12-10-14  Time of Death - 41  Notified by RN that patient passed away, at above time and date. Patient family and chaplin at bedside. Expressed condolences.   Stephanie Acre, MD [facility] Pager [phone] (Please enter 7-digits)

## 2014-11-23 NOTE — Progress Notes (Signed)
Family Update:  I have spoken with patient's husband, health care power of attorney, and daughter. At this time they have decided on no escalation of care, and in the event she has a another cardiac arrest then no ACLS protocol. She is a limited code, no ACLS drugs, no CPR, no defibrillation.  We are to continue with mechanical ventilation for now, and hold pressors at current level. Family understands the critical illness of the patient and poor prognosis. Again, no further escalation of care.  Stephanie Acre, MD [facility] Pager [phone] (Please enter 7-digits)

## 2014-11-23 NOTE — Consult Note (Signed)
Surgical Consultation  Nov 28, 2014  Gina Sandoval is an 59 y.o. female.   CC: Hypotension  HPI: This a patient with a complicated medical history who is currently intubated and hypotensive. I was asked see the patient by Dr. Dema Severin for possible bowel obstruction based on a high NG output. History is obtained from the chart and from Dr. Dema Severin. My understanding of the patient's history is that she has had nausea vomiting and poor appetite and bloody stools for the last week or so. She was seen at [facility] transplant service yesterday and rejected for a renal transplant listing due to comorbidities and her overall condition at the time of being seen. In fact Dr. Tinnie Gens obtained history from the family that she had refused emergency room evaluation at [facility] yesterday at the urging of the transplant service which was suggested due to her ill condition at the time of that consultation. She presented today to the emergency room frankly hypotensive and tachycardic and was intubated due to shock with signs of infected dialysis line, and considerable CHF, and possible abdominal process. Again I was asked see the patient for signs of bowel obstruction but without was based solely on high NG output and a single flat plate KUB showing a large gastric bubble in a patient who is severely acidotic and hypotensive  Past Medical History  Diagnosis Date  . Renal disorder   . Hemodialysis patient   . A-fib   . CHF (congestive heart failure)   . Coronary artery disease   . Myocardial infarction   . Hypertension     Past Surgical History  Procedure Laterality Date  . Cholecystectomy    . Abdominal aortic aneurysm repair  2014    Family History  Problem Relation Age of Onset  . CAD Mother   . CAD Brother     Social History:  reports that she has been smoking.  She does not have any smokeless tobacco history on file. She reports that she does not drink alcohol or use illicit drugs.  Allergies: Not on  File  Medications reviewed.   Review of Systems:   Review of Systems  Unable to perform ROS: acuity of condition     Physical Exam:  BP 62/30 mmHg  Pulse 142  Temp(Src) 97.5 F (36.4 C) (Oral)  Resp 24  Ht  (1.753 m)  Wt 190 lb (86.183 kg)  BMI 28.05 kg/m2  SpO2 34%  Physical Exam  Constitutional:  Patient is intubated and chronically ill-appearing her eyes are open and she moves her head about thrashing. Vital signs are unstable  HENT:  Head: Normocephalic and atraumatic.  Eyes: No scleral icterus.  Neck: Neck supple.  Cardiovascular: Normal rate.   Tachycardic in the 130s  Pulmonary/Chest: She is in respiratory distress.  Intubated on full mechanical support  Abdominal: Soft. She exhibits no distension. There is no tenderness. There is no rebound and no guarding.  Abdomen shows a long midline incision without hernia it is soft nondistended nontender,nontympanitic, with no guarding no rebound no percussion tenderness.  Musculoskeletal: She exhibits edema.  Considerable edema of the lower extremities  Neurological:  Patient is sedated on the vent but is thrashing about but nonfocal  Skin: No rash noted. No erythema.  Psychiatric:  Not obtainable      Results for orders placed or performed during the hospital encounter of 2014/11/28 (from the past 48 hour(s))  CBC with Differential     Status: Abnormal   Collection Time: 11/28/14  9:04  AM  Result Value Ref Range   WBC 8.3 3.6 - 11.0 K/uL   RBC 4.13 3.80 - 5.20 MIL/uL   Hemoglobin 12.2 12.0 - 16.0 g/dL   HCT 16.1 09.6 - 04.5 %   MCV 95.8 80.0 - 100.0 fL   MCH 29.5 26.0 - 34.0 pg   MCHC 30.8 (L) 32.0 - 36.0 g/dL   RDW 40.9 (H) 81.1 - 91.4 %   Platelets 47 (L) 150 - 400 K/uL    Comment: PLATELET COUNT CONFIRMED BY SMEAR PATIENT CASE DISCUSSED BY DR Forde Dandy WITH DR Mayford Knife ON 11/26/14. WILL FOLLOW UP WITH FURTHER TESTING    Neutrophils Relative % 90 (H) 43 - 77 %   Lymphocytes Relative 5 (L) 12 - 46 %    Monocytes Relative 5 3 - 12 %   Eosinophils Relative 0 0 - 5 %   Basophils Relative 0 0 - 1 %   Band Neutrophils 0 0 - 10 %   Metamyelocytes Relative 0 %   Myelocytes 0 %   Promyelocytes Absolute 0 %   Blasts 0 %   nRBC 1 (H) 0 /100 WBC   Other 0 %   Neutro Abs 7.5 1.7 - 7.7 K/uL   Lymphs Abs 0.4 (L) 0.7 - 4.0 K/uL   Monocytes Absolute 0.4 0.1 - 1.0 K/uL   Eosinophils Absolute 0.0 0.0 - 0.7 K/uL   Basophils Absolute 0.0 0.0 - 0.1 K/uL   RBC Morphology POLYCHROMASIA PRESENT     Comment: SCHISTOCYTES PRESENT (2-5/hpf) RARE NRBCs BURR CELLS    Smear Review LARGE PLATELETS PRESENT   Brain natriuretic peptide     Status: Abnormal   Collection Time: 11/26/14  9:04 AM  Result Value Ref Range   B Natriuretic Peptide >4500.0 (H) 0.0 - 100.0 pg/mL  Troponin I     Status: Abnormal   Collection Time: 11/26/2014  9:04 AM  Result Value Ref Range   Troponin I 0.54 (H) <0.031 ng/mL    Comment: READ BACK AND VERIFIED WITH DANA DUGGINS AT 0948 ON 11-26-2014 BY [facility]        POSSIBLE MYOCARDIAL ISCHEMIA. SERIAL TESTING RECOMMENDED.   Protime-INR     Status: Abnormal   Collection Time: 2014/11/26  9:04 AM  Result Value Ref Range   Prothrombin Time 26.7 (H) 11.4 - 15.0 seconds   INR 2.45   APTT     Status: Abnormal   Collection Time: 2014-11-26  9:04 AM  Result Value Ref Range   aPTT 38 (H) 24 - 36 seconds    Comment:        IF BASELINE aPTT IS ELEVATED, SUGGEST PATIENT RISK ASSESSMENT BE USED TO DETERMINE APPROPRIATE ANTICOAGULANT THERAPY.   Hepatic function panel     Status: Abnormal   Collection Time: 26-Nov-2014  9:04 AM  Result Value Ref Range   Total Protein 6.0 (L) 6.5 - 8.1 g/dL   Albumin 3.3 (L) 3.5 - 5.0 g/dL   AST 782 (H) 15 - 41 U/L   ALT 61 (H) 14 - 54 U/L   Alkaline Phosphatase 118 38 - 126 U/L   Total Bilirubin 4.2 (H) 0.3 - 1.2 mg/dL   Bilirubin, Direct 2.5 (H) 0.1 - 0.5 mg/dL   Indirect Bilirubin 1.7 (H) 0.3 - 0.9 mg/dL  Lactic acid, plasma     Status: Abnormal   Collection  Time: 26-Nov-2014  9:37 AM  Result Value Ref Range   Lactic Acid, Venous 11.8 (HH) 0.5 - 2.0 mmol/L  Comment: CRITICAL RESULT CALLED TO, READ BACK BY AND VERIFIED WITH DANA DUGGINS ON 11/17/2014 AT 1019 BY [facility] RESULTS VERIFIED BY REPEAT TESTING   Glucose, capillary     Status: Abnormal   Collection Time: 2014/11/17  1:11 PM  Result Value Ref Range   Glucose-Capillary <10 (LL) 65 - 99 mg/dL  Glucose, capillary     Status: Abnormal   Collection Time: 17-Nov-2014  1:18 PM  Result Value Ref Range   Glucose-Capillary <10 (LL) 65 - 99 mg/dL  Blood gas, arterial     Status: Abnormal (Preliminary result)   Collection Time: 11/17/2014  1:25 PM  Result Value Ref Range   FIO2 1.00 %   Delivery systems VENTILATOR    Mode PRESSURE CONTROL    Rate 16 resp/min   Peep/cpap 5.0 cm H20   Pressure control 25 cm H20   pH, Arterial 7.00 (LL) 7.350 - 7.450    Comment: RBV TO DR.MUNGAL AT 1330 ON 11-17-2014 BY [facility]   pCO2 arterial 35 32.0 - 48.0 mmHg   pO2, Arterial 105 83.0 - 108.0 mmHg   Bicarbonate 8.6 (L) 21.0 - 28.0 mEq/L   Acid-base deficit 21.9 (H) 0.0 - 2.0 mmol/L   O2 Saturation 94.0 %   Patient temperature 37.0    Collection site RIGHT RADIAL    Sample type ARTERIAL DRAW    Allens test (pass/fail) PENDING PASS   Dg Chest 1 View  Nov 17, 2014   CLINICAL DATA:  Post intubation  EXAM: CHEST  1 VIEW  COMPARISON:  11-17-2014  FINDINGS: Endotracheal tube is appropriately positioned. Left IJ approach hemodialysis catheter terminates over the mid SVC. Marked enlargement of the cardiomediastinal silhouette is noted with increase in diffuse perihilar ground-glass airspace opacities superimposed on interstitial edema. Small pleural effusions are noted. Pacing pad in place.  IMPRESSION: Endotracheal tube appropriately positioned.  Increased perihilar airspace opacities with a pattern most typical for alveolar edema superimposed on interstitial edema.  Marked cardiomegaly.   Electronically Signed   By: Christiana Pellant M.D.    On: 2014-11-17 13:05   Dg Chest 1 View  11/17/14   CLINICAL DATA:  Unsuccessful central line placement.  EXAM: CHEST  1 VIEW  COMPARISON:  Same day.  FINDINGS: Moderate cardiomegaly is noted. Left-sided dialysis catheter is noted with distal tip in the expected position of the SVC. Mild central pulmonary vascular congestion is noted. No pneumothorax or pleural effusion is noted. Bony thorax is intact.  IMPRESSION: Moderate cardiomegaly with mild central pulmonary vascular congestion.   Electronically Signed   By: Lupita Raider, M.D.   On: 11/17/2014 12:04   Dg Abd 1 View  11/17/2014   CLINICAL DATA:  Orogastric tube placement  EXAM: ABDOMEN - 1 VIEW  COMPARISON:  None.  FINDINGS: Orogastric tube tip and side port are in the stomach. There is gastric distention. There is no small bowel are large bowel dilatation. No air-fluid level. No free air. There are multiple surgical clips in the abdomen. There is atherosclerotic change in the aorta.  IMPRESSION: Orogastric tube tip and side port in stomach. Stomach is distended. No smaller large bowel dilatation.   Electronically Signed   By: Bretta Bang III M.D.   On: Nov 17, 2014 13:54   Dg Chest Port 1 View  2014-11-17   CLINICAL DATA:  Hypotension  EXAM: PORTABLE CHEST - 1 VIEW  COMPARISON:  None.  FINDINGS: Severe cardiomegaly. Left internal jugular dialysis catheter tip in the upper SVC. Hazy pulmonary edema throughout both central  lungs. No pneumothorax.  IMPRESSION: CHF with central edema.   Electronically Signed   By: Jolaine Click M.D.   On: 11-12-14 13:19    Assessment/Plan:  This a patient with profound hypotension and acidosis with a pH of 7.00 and lactic acid of 11. Of course in a patient with atrial fibrillation and critical illness the concern for ischemic bowel is always present however her abdominal exam in a nonparalyzed patient is completely soft nondistended and nontender. The history of nausea vomiting and bloody stools is  unclear at this time. I suggestion is to obtain a CT scan once the patient is stable enough to be placed in radiology. However I do not believe that this patient would be a candidate for general anesthesia with her low blood pressure. Her systolic blood pressure is been in the 30s most of the day and it is higher now but still she is quite unstable with a heart rate in the 130s. I examined the patient with Dr. Gilda Crease and discussed the patient's condition with Dr. Gilda Crease and Dr. Dema Severin. In brief this is a critically ill patient who I doubt would survive a lot and exploratory laparotomy but warrants evaluation with CT scan.. I will evaluate the patient later. Dr. Dema Severin spoken to the family and they want everything done which is my understanding is a full code but again I do not believe the patient would be an adequate surgical candidate due to her inability to 5 induction anesthesia. Over 65 minutes was spent in direct patient contact or with reviewing chart and studies personally and discussing with the other physicians.  Lattie Haw, MD, FACS

## 2014-11-23 NOTE — ED Notes (Signed)
Dr Mayford Knife in to insert central line

## 2014-11-23 NOTE — Op Note (Signed)
  OPERATIVE NOTE   PROCEDURE: 1. Insertion of temporary dialysis catheter catheter right femoral approach.  PRE-OPERATIVE DIAGNOSIS: catheter related sepsis, end stage renal disease, septic shock  POST-OPERATIVE DIAGNOSIS: same  SURGEON: Renford Dills M.D.  ANESTHESIA: 1% lidocaine local infiltration  ESTIMATED BLOOD LOSS: Minimal cc  INDICATIONS:   Gina Sandoval is a 59 y.o. female who presents with septic shock and likely catheter related sepsis.  DESCRIPTION: After obtaining full informed written consent, the patient was positioned supine. The right groin was prepped and draped in a sterile fashion. Ultrasound was placed in a sterile sleeve. Ultrasound was utilized to identify the right femoral vein which is noted to be echolucent and compressible indicating patency. Images recorded for the permanent record. Under real-time visualization a Seldinger needle is inserted into the vein and the guidewires advanced without difficulty. Small counterincision was made at the wire insertion site. Dilator is passed over the wire and the temporary dialysis catheter catheter is fed over the wire without difficulty.  All lumens aspirate and flush easily and are packed with heparin saline. Catheter secured to the skin of the thigh with 2-0 silk. A sterile dressing is applied with Biopatch.  COMPLICATIONS: none  CONDITION: unchanged Renford Dills, M.D. [place] [facility]. Office:  [phone]

## 2014-11-23 NOTE — ED Notes (Signed)
Preparing for intubation. Dr Mayford Knife and RT Lucille Passy at bedside 1213 100% non rebreather 1214 bp  63/46 1215 etomidate 20mg  1216 succs 100mg , bagging 1217 7.5 at 23cm, equal breath sounds, color change to ETCO2

## 2014-11-23 NOTE — Op Note (Signed)
OPERATIVE NOTE   PROCEDURE: 1. Insertion of arterial catheter right femoral approach.  PRE-OPERATIVE DIAGNOSIS: Catheter related sepsis, septic shock, respiratory failure  POST-OPERATIVE DIAGNOSIS: same  SURGEON: Renford Dills M.D.  ANESTHESIA: 1% lidocaine local infiltration  ESTIMATED BLOOD LOSS: Minimal cc  INDICATIONS:   Zulmy Bettcher is a 58 y.o. female who presents with septic shokc likely secondary to a tunneled dialysis catheter.  Her femoral artery is selected as she does not have radial pulses bilaterally and has bilateral AV access.  DESCRIPTION: After obtaining full informed written consent, the patient was positioned supine. The right groin was prepped and draped in a sterile fashion. Ultrasound was placed in a sterile sleeve. Ultrasound was utilized to identify the femoral artery which is noted to be echolucent and pulsatile indicating patency. Images recorded for the permanent record. Under real-time visualization a Micro-needle is inserted into the artery and the guidewire is advanced without difficulty. Small counterincision was made at the wire insertion site. Arterial catheter is passed over the wire without difficulty.  Catheter secured to the skin of the thigh with 2-0 silk. A sterile dressing is applied with Biopatch.  COMPLICATIONS: none  CONDITION: unchanged  Renford Dills, M.D. [employer] [facility]. Office:  [phone]   2014-12-02, 3:40 PM

## 2014-11-23 NOTE — ED Notes (Signed)
Pt preseents with N/VD  For a week. Blood in stool. No food since yesterday. C/o weakness.

## 2014-11-23 NOTE — Consult Note (Signed)
Primary Physician: Primary Cardiologist:  New   HPI: Patient is a 59 yo who we are asked to see re afib with RVR  Pt has a history of ESRD (HD started in Dec 2015), CAD (Hx MI with stents x 3 in past), CHF, HTN.  Seen at [facility] yesterday for transplant eval.  Presents to ER with SOB  BP low  HR in 130s to 140s   Last dialysis 2 days ago   Now Plan to remove permacath for suspected sepsis.  REcords from [facility]:  Report of echo for 09/2011;  Severe LVH.  Restrictive filling pattern, basal inferior akinesis.  Moderate MR  Fixed posterior MVL  LVEF >55% Hx diastolic CHF   CAD  NSTEMI x 3  PCI x 2 to RCA and LCx  Last in 2013   Hx AFIB  S/p DCCV in 04/2010.  On chronic coumadin PVOD OSA  Noes not use CPAP Hx colon polyp Hx tobacco abuse HL GERD    Patient is now intubated, hypotensive    Past Medical History  Diagnosis Date  . Renal disorder   . Hemodialysis patient   . A-fib   . CHF (congestive heart failure)   . Coronary artery disease   . Myocardial infarction   . Hypertension    PSH:  AAA repair 2014  Carpal tunnel surgery.  TAH.  Tubal ligation.  Choley     Medications Prior to Admission  Medication Sig Dispense Refill  . calcium acetate (PHOSLO) 667 MG capsule Take 667 mg by mouth 3 (three) times daily with meals.    Marland Kitchen diltiazem (CARDIZEM) 60 MG tablet Take 60 mg by mouth 3 (three) times daily.    . metoprolol (LOPRESSOR) 50 MG tablet Take 50 mg by mouth 2 (two) times daily.    . pantoprazole (PROTONIX) 40 MG tablet Take 40 mg by mouth daily.       . pantoprazole (PROTONIX) IV  40 mg Intravenous Q12H  . piperacillin-tazobactam (ZOSYN)  IV  3.375 g Intravenous Once    Infusions: . vasopressin (PITRESSIN) infusion - *FOR SHOCK*      Not on File  History   Social History  . Marital Status: Married    Spouse Name: N/A  . Number of Children: N/A  . Years of Education: N/A   Occupational History  . Not on file.   Social History Main Topics  . Smoking  status: Light Tobacco Smoker  . Smokeless tobacco: Not on file  . Alcohol Use: No  . Drug Use: No  . Sexual Activity: Not on file   Other Topics Concern  . Not on file   Social History Narrative  . No narrative on file    Family History  Problem Relation Age of Onset  . CAD Mother   . CAD Brother    CAD fother  HTN sister  REVIEW OF SYSTEMS:  All systems reviewed  Negative to the above problem except as noted above.    PHYSICAL EXAM: Filed Vitals:   22-Nov-2014 1249  BP: 62/30  Pulse:   Temp:   Resp:     No intake or output data in the 24 hours ending Nov 22, 2014 1303  General: Pt intubated  NG tube with coffee colored fluid (approx 500cc-1L) HEENT: normal Neck: suppleUnable to assess JVP  Cor: Tachy  No definite S3.  II/VI systolic murmur at apex Lungs: Rhonchi bilaterally Abdomen: Sl distended. Markedly decreased BS Extremities: no cyanosis, clubbing,Tr edema legs  Feet cool  Neuro:  Pt intubated  Deferred    ECG:  Atrial fib  130 bpm  IWMI    Results for orders placed or performed during the hospital encounter of 2014-11-20 (from the past 24 hour(s))  CBC with Differential     Status: Abnormal   Collection Time: 11-20-2014  9:04 AM  Result Value Ref Range   WBC 8.3 3.6 - 11.0 K/uL   RBC 4.13 3.80 - 5.20 MIL/uL   Hemoglobin 12.2 12.0 - 16.0 g/dL   HCT 16.1 09.6 - 04.5 %   MCV 95.8 80.0 - 100.0 fL   MCH 29.5 26.0 - 34.0 pg   MCHC 30.8 (L) 32.0 - 36.0 g/dL   RDW 40.9 (H) 81.1 - 91.4 %   Platelets 47 (L) 150 - 400 K/uL   Neutrophils Relative % 90 (H) 43 - 77 %   Lymphocytes Relative 5 (L) 12 - 46 %   Monocytes Relative 5 3 - 12 %   Eosinophils Relative 0 0 - 5 %   Basophils Relative 0 0 - 1 %   Band Neutrophils 0 0 - 10 %   Metamyelocytes Relative 0 %   Myelocytes 0 %   Promyelocytes Absolute 0 %   Blasts 0 %   nRBC 1 (H) 0 /100 WBC   Other 0 %   Neutro Abs 7.5 1.7 - 7.7 K/uL   Lymphs Abs 0.4 (L) 0.7 - 4.0 K/uL   Monocytes Absolute 0.4 0.1 - 1.0 K/uL    Eosinophils Absolute 0.0 0.0 - 0.7 K/uL   Basophils Absolute 0.0 0.0 - 0.1 K/uL   RBC Morphology POLYCHROMASIA PRESENT    Smear Review LARGE PLATELETS PRESENT   Brain natriuretic peptide     Status: Abnormal   Collection Time: November 20, 2014  9:04 AM  Result Value Ref Range   B Natriuretic Peptide >4500.0 (H) 0.0 - 100.0 pg/mL  Troponin I     Status: Abnormal   Collection Time: 2014/11/20  9:04 AM  Result Value Ref Range   Troponin I 0.54 (H) <0.031 ng/mL  Protime-INR     Status: Abnormal   Collection Time: Nov 20, 2014  9:04 AM  Result Value Ref Range   Prothrombin Time 26.7 (H) 11.4 - 15.0 seconds   INR 2.45   APTT     Status: Abnormal   Collection Time: 2014/11/20  9:04 AM  Result Value Ref Range   aPTT 38 (H) 24 - 36 seconds  Hepatic function panel     Status: Abnormal   Collection Time: 11/20/2014  9:04 AM  Result Value Ref Range   Total Protein 6.0 (L) 6.5 - 8.1 g/dL   Albumin 3.3 (L) 3.5 - 5.0 g/dL   AST 782 (H) 15 - 41 U/L   ALT 61 (H) 14 - 54 U/L   Alkaline Phosphatase 118 38 - 126 U/L   Total Bilirubin 4.2 (H) 0.3 - 1.2 mg/dL   Bilirubin, Direct 2.5 (H) 0.1 - 0.5 mg/dL   Indirect Bilirubin 1.7 (H) 0.3 - 0.9 mg/dL  Lactic acid, plasma     Status: Abnormal   Collection Time: 20-Nov-2014  9:37 AM  Result Value Ref Range   Lactic Acid, Venous 11.8 (HH) 0.5 - 2.0 mmol/L   Dg Chest 1 View  11/20/2014   CLINICAL DATA:  Unsuccessful central line placement.  EXAM: CHEST  1 VIEW  COMPARISON:  Same day.  FINDINGS: Moderate cardiomegaly is noted. Left-sided dialysis catheter is noted with distal tip in the expected position of  the SVC. Mild central pulmonary vascular congestion is noted. No pneumothorax or pleural effusion is noted. Bony thorax is intact.  IMPRESSION: Moderate cardiomegaly with mild central pulmonary vascular congestion.   Electronically Signed   By: Lupita Raider, M.D.   On: 11/21/14 12:04     ASSESSMENT:  Unfortunate 59 yo with multiple medical problems including CAD,  afib, PVOD. Presents to [facility] with hypotension.  IN afib with RVR.   Patient is now intubated.  Critical care following.  Being treatdd for sepsis  From a cardiac standpoint  Agree with pressors to increase perfusion. Limited on Rx for afib for now given BP  Hopefully rates will improve once pt is treated. Should not be on coumadin until seen by GI  Nephrology will also see pt for placement of temporary cath and dialysis.  I expect bump in troponin with renal dysfunction, hypotension and sepsis  Would treat as doing.  No other recommendations for now. Will continue to follow   Pt is critically ill.  Dietrich Pates

## 2014-11-23 NOTE — ED Provider Notes (Addendum)
[facility] Emergency Department Provider Note     Time seen: ----------------------------------------- 8:43 AM on December 07, 2014 -----------------------------------------    I have reviewed the triage vital signs and the nursing notes.  Level V caveat: Review of systems and history is difficult to obtain due to altered mental status HISTORY  Chief Complaint No chief complaint on file.    HPI Gina Sandoval is a 59 y.o. female brought the ER for vomiting over the last week, and able to keep anything down. She also seen some blood in her stools. She missed dialysis today due to feeling ill. She is not sure who her nephrologist is, she is not sure what her dry weight is.   No past medical history on file.  There are no active problems to display for this patient.   No past surgical history on file.  Allergies Review of patient's allergies indicates not on file.  Social History History  Substance Use Topics  . Smoking status: Not on file  . Smokeless tobacco: Not on file  . Alcohol Use: Not on file    Review of Systems Constitutional: Negative for fever. Cardiovascular: Negative for chest pain. Respiratory: Positive for shortness of breath Gastrointestinal: Positive for abdominal pain and vomiting, blood in stools Genitourinary: Negative for dysuria. Musculoskeletal: Negative for back pain. Positive for edema Skin: Positive blisters on legs Neurological: Positive for weakness  10-point ROS otherwise negative, or unknown  ____________________________________________   PHYSICAL EXAM:  VITAL SIGNS: ED Triage Vitals  Enc Vitals Group     BP --      Pulse --      Resp --      Temp --      Temp src --      SpO2 --      Weight --      Height --      Head Cir --      Peak Flow --      Pain Score --      Pain Loc --      Pain Edu? --      Excl. in GC? --     Constitutional: Alert, disoriented Moderate distress Eyes: Pale  conjunctiva. Normal extraocular movements. ENT   Head: Normocephalic and atraumatic.   Nose: No congestion/rhinnorhea.   Mouth/Throat: Mucous membranes are moist.   Neck: No stridor. Cardiovascular: Rapid rate, irregular rhythm. Diminished distal pulses, cool extremities Respiratory: Tachypnea, Breath sounds are clear and equal bilaterally. No wheezes/rales/rhonchi. Gastrointestinal: Mild diffuse abdominal tenderness, hypoactive bowel sounds, No distention.  Musculoskeletal: Nontender, markedly edema noted bilaterally. Neurologic:  Normal speech and language. Effort dependent exam, generalized weakness Skin:  There is peripheral edema with blistering of the skin and lower extremities Psychiatric: Flat mood and affect. ____________________________________________  EKG: Interpreted by me. Atrial fibrillation with a rapid ventricular response, rate is 130 bpm, QRS with normal, axis is normal. No evidence of acute infarction.  ____________________________________________  ED COURSE:  Pertinent labs & imaging results that were available during my care of the patient were reviewed by me and considered in my medical decision making (see chart for details). Patient appears ill, multifactorial. We'll need extensive workup and admission.  Patient hypotensive, external jugular vein access on the right performed by me. Patient given serial 250 cc normal saline boluses. Blood pressure remains critically low, patient still to, appears in shock. Discussed with Nephrology Will [address]., Levothroid through Vas-Cath. ____________________________________________ CRITICAL CARE Performed by: Daryel November E   Total critical care  time: 30 minutes  Critical care time was exclusive of separately billable procedures and treating other patients.  Critical care was necessary to treat or prevent imminent or life-threatening deterioration.  Critical care was time spent personally by me on the  following activities: development of treatment plan with patient and/or surrogate as well as nursing, discussions with consultants, evaluation of patient's response to treatment, examination of patient, obtaining history from patient or surrogate, ordering and performing treatments and interventions, ordering and review of laboratory studies, ordering and review of radiographic studies, pulse oximetry and re-evaluation of patient's condition.    LABS (pertinent positives/negatives)  Labs Reviewed  CBC WITH DIFFERENTIAL/PLATELET - Abnormal; Notable for the following:    MCHC 30.8 (*)    RDW 19.5 (*)    Platelets 47 (*)    Neutrophils Relative % 90 (*)    Lymphocytes Relative 5 (*)    nRBC 1 (*)    Lymphs Abs 0.4 (*)    All other components within normal limits  BRAIN NATRIURETIC PEPTIDE - Abnormal; Notable for the following:    B Natriuretic Peptide >4500.0 (*)    All other components within normal limits  TROPONIN I - Abnormal; Notable for the following:    Troponin I 0.54 (*)    All other components within normal limits  PROTIME-INR - Abnormal; Notable for the following:    Prothrombin Time 26.7 (*)    All other components within normal limits  LACTIC ACID, PLASMA - Abnormal; Notable for the following:    Lactic Acid, Venous 11.8 (*)    All other components within normal limits  APTT - Abnormal; Notable for the following:    aPTT 38 (*)    All other components within normal limits  CULTURE, BLOOD (ROUTINE X 2)  CULTURE, BLOOD (ROUTINE X 2)  LACTIC ACID, PLASMA    RADIOLOGY Chest x-ray reveals marked cardiomegaly without overt infection  CENTRAL LINE Performed by: Daryel November E Consent: The procedure was performed in an emergent situation. Required items: required blood products, implants, devices, and special equipment available Patient identity confirmed: arm band and provided demographic data Time out: Immediately prior to procedure a "time out" was called to  verify the correct patient, procedure, equipment, support staff and site/side marked as required. Indications: vascular access Anesthesia: local infiltration Local anesthetic: lidocaine without epinephrine  Anesthetic total: 3 ml Patient sedated: no Preparation: skin prepped with 2% chlorhexidine Skin prep agent dried: skin prep agent completely dried prior to procedure Sterile barriers: all five maximum sterile barriers used - cap, mask, sterile gown, sterile gloves, and large sterile sheet Hand hygiene: hand hygiene performed prior to central venous catheter insertion  Location details: Right IJ   Catheter type: triple lumen Catheter size: 8 Fr Pre-procedure: landmarks identified Ultrasound guidance: Yes  Successful placement: yes  central line was unsuccessful, inadvertent access of right carotid artery. After observation in the ER there was no significant expanding hematoma  INTUBATION Performed by: Daryel November E  Required items: required blood products, implants, devices, and special equipment available Patient identity confirmed: provided demographic data and hospital-assigned identification number Time out: Immediately prior to procedure a "time out" was called to verify the correct patient, procedure, equipment, support staff and site/side marked as required.  Indications: Respiratory distress   Intubation method: Glidescope Laryngoscopy   Preoxygenation: BVM  Sedatives: 20 mg Etomidate Paralytic: 100 mg Succinylcholine  Tube Size: 7.5 cuffed  Post-procedure assessment: chest rise and ETCO2 monitor Breath sounds: equal and absent over  the epigastrium Tube secured with: ETT holder Chest x-ray interpreted by radiologist and me.  Chest x-ray findings: 7.5 endotracheal tube in appropriate position  Patient tolerated the procedure well with no immediate complications.    ____________________________________________  FINAL ASSESSMENT AND PLAN  Weakness,  vomiting, end-stage renal disease, rectal bleeding, sepsis  Plan: Patient still hypotensive, being placed on Levaquin. Nephrology in consultation. Bank and Zosyn being given to cover for infection. Family states patient has had vomiting and diarrhea for the last 2 weeks with also bright red blood per rectum. Patient will need ICU admission. Does not want intubation at this time does want medications to artificially augment her blood pressure.   Emily Filbert, MD   Emily Filbert, MD 11/17/14 1115  Emily Filbert, MD 2014-11-17 [id number]

## 2014-11-23 NOTE — ED Notes (Signed)
Dr Mayford Knife and Dr Thedore Mins aware of lactic acid 11.8. Central line supplies brought to bedside

## 2014-11-23 NOTE — Consult Note (Signed)
[facility] Vascular Consult Note  MRN : [id number]   History of Present Illness:   Gina Sandoval is a 59 y.o. (1955-06-05) female who presented to the ED with chief complaint of nausea, vomiting and bloody stools over the last week.   Past medical history of ESRD started HD in dec 2015, HTN, CAD/MI with h/o coronary stents x 3 and CHF.  Patient is from [place] and dialyzes at [facility] dialysis. She was temporarily visiting [place] [address] [place] because of a transplant eval at [facility] yesterday. In the emergency room she was found to be short of breath, severely hypotensive and tachycardic. She was intubated and transferred to the ICU.  Consulted to place central line, A-line, temporary dialysis catheter and removal or infected permcath.   Chief Complaint  Patient presents with   Nausea   Current Facility-Administered Medications  Medication Dose Route Frequency Provider Last Rate Last Dose   0.9 %  sodium chloride infusion   Intravenous Once Vishal Mungal, MD       dexmedetomidine (PRECEDEX) 400 MCG/100ML (4 mcg/mL) infusion  0.4-1.2 mcg/kg/hr Intravenous Titrated Vishal Mungal, MD       dextrose 25% IV injection  25 mg Intravenous Once Vishal Mungal, MD       fentaNYL in NS (29mcg/ml) infusion-PREMIX  10 mcg/hr Intravenous Continuous Vishal Mungal, MD       heparin injection 1,000-6,000 Units  1,000-6,000 Units CRRT PRN Mosetta Pigeon, MD       hydrocortisone sodium succinate (SOLU-CORTEF) 100 MG injection 50 mg  50 mg Intravenous Q6H Vishal Mungal, MD       iohexol (OMNIPAQUE) 240 MG/ML injection 25 mL  25 mL Oral Q1 Hr x 2 Medication Radiologist, MD       norepinephrine (LEVOPHED) 16 mg in dextrose 5 % 250 mL (0.064 mg/mL) infusion  16 mcg/min Intravenous Titrated Lattie Haw, MD       pantoprazole (PROTONIX) injection 40 mg  40 mg Intravenous Q12H Gale Journey, MD       piperacillin-tazobactam (ZOSYN) IVPB 3.375 g  3.375 g  Intravenous 3 times per day Lattie Haw, MD       pureflow IV solution for Dialysis   CRRT Continuous Mosetta Pigeon, MD       Melene Muller ON 11/11/2014] vancomycin (VANCOCIN) IVPB 1000 mg/200 mL premix  1,000 mg Intravenous Q24H Gale Journey, MD       vasopressin (PITRESSIN) 40 Units in sodium chloride 0.9 % 250 mL (0.16 Units/mL) infusion  0.03 Units/min Intravenous Continuous Vishal Mungal, MD 11.3 mL/hr at 11-17-14 1338 0.03 Units/min at Nov 17, 2014 1338    Past Medical History  Diagnosis Date   Renal disorder    Hemodialysis patient    A-fib    CHF (congestive heart failure)    Coronary artery disease    Myocardial infarction    Hypertension     Past Surgical History  Procedure Laterality Date   Cholecystectomy     Abdominal aortic aneurysm repair  2014    Social History History  Substance Use Topics   Smoking status: Light Tobacco Smoker   Smokeless tobacco: Not on file   Alcohol Use: No    Family History Family History  Problem Relation Age of Onset   CAD Mother    CAD Brother     Not on File   REVIEW OF SYSTEMS (Negative unless checked)  Constitutional: [] Weight loss  [] Fever  [] Chills Cardiac: [] Chest pain   [] Chest pressure   []   Palpitations   Shortness of breath when laying flat   Shortness of breath at rest   Shortness of breath with exertion. Vascular:  Pain in legs with walking   Pain in legs at rest   Pain in legs when laying flat   Claudication   Pain in feet when walking  Pain in feet at rest  Pain in feet when laying flat   History of DVT   Phlebitis   Swelling in legs   Varicose veins   Non-healing ulcers Pulmonary:   Uses home oxygen   Productive cough   Hemoptysis   Wheeze  COPD   Asthma Neurologic:  Dizziness  Blackouts   Seizures   History of stroke   History of TIA  Aphasia   Temporary blindness   Dysphagia   Weakness or numbness in arms   Weakness or numbness in  legs Musculoskeletal:  Arthritis   Joint swelling   Joint pain   Low back pain Hematologic:  Easy bruising  Easy bleeding   Hypercoagulable state   Anemic  Hepatitis Gastrointestinal:  Blood in stool   Vomiting blood  Gastroesophageal reflux/heartburn   Difficulty swallowing. Genitourinary:  Chronic kidney disease   Difficult urination  Frequent urination  Burning with urination   Blood in urine Skin:  Rashes   Ulcers   Wounds Psychological:  History of anxiety    History of major depression.  **Unable to review systems as patient is intubated and sedated.**  Physical Examination  Filed Vitals:   11-22-14 1400 Nov 22, 2014 1405 Nov 22, 2014 1415 2014-11-22 1420  BP:  45/24  31/15  Pulse: 34 68    Temp: 96.1 F (35.6 C) 96.1 F (35.6 C) 96.1 F (35.6 C) 96.1 F (35.6 C)  TempSrc:      Resp: 34 35 28 37  Height:      Weight:      SpO2: 43% 39%     Body mass index is 28.05 kg/(m^2).  Head: [place]/AT, No temporalis wasting. Prominent temp pulse not noted. Ear/Nose/Throat: nares w/o erythema or drainage, oropharynx w/o Erythema/Exudate Eyes: sclero edema, sclera muddied.  Neck: Supple, no nuchal rigidity.  No bruit or JVD.  Pulmonary:  Intubated, course breath sounds Cardiac: Tachycardic, normal S1, S2, no Murmurs, rubs or gallops. Vascular:  Vessel Right Left  Radial Not Palpable Not Palpable  Ulnar Palpable Palpable  Brachial Palpable Palpable  Carotid Palpable, without bruit Palpable, without bruit  Aorta Not palpable N/A  Femoral Palpable Palpable  Popliteal Palpable Palpable  PT Palpable Palpable  DP Palpable Palpable   Gastrointestinal: soft, non-tender/non-distended. No guarding/reflex. No masses. Musculoskeletal: Extremities without ischemic changes.  No deformity or atrophy.  Neurologic: Unresponsive, sedated and intubated Psychiatric: Unresponsive, sedated and intubated Dermatologic: No rashes or ulcers noted.  No cellulitis or  open wounds. Lymph : No Cervical, Axillary, or Inguinal lymphadenopathy.   CBC Lab Results  Component Value Date   WBC 8.3 11-22-14   HGB 12.2 11/22/14   HCT 39.5 11/22/2014   MCV 95.8 11/22/14   PLT 47* 11/22/14    BMET No results found for: NA, K, CL, CO2, GLUCOSE, BUN, CREATININE, CALCIUM, GFRNONAA, GFRAA CrCl cannot be calculated (Patient has no serum creatinine result on file.).  COAG Lab Results  Component Value Date   INR 2.45 11-22-14    Assessment/Plan 59 year old female on HD in respiratory failure from possible sepsis:  Placed right femoral artery line without complication ( radial pulses not palpable). Placed right femoral vein temporary  dialysis catheter without complication. Placed left femoral vein triple lumen central catheter without complication. Removed left IJ permcath without complication.  Care as per ICU team.  Tonette Lederer  11/13/2014 3:27 PM

## 2014-11-23 NOTE — ED Notes (Signed)
Dr Mayford Knife informed of bp 53/20. At bedside to reassess.

## 2014-11-23 NOTE — Progress Notes (Addendum)
PULMONARY / CRITICAL CARE MEDICINE   Name: Gina [facility] MRN: [id number] DOB: [birth year]    ADMISSION DATE:  December 07, 2014 CONSULTATION DATE:  12/07/14  REFERRING MD :  Dr. Volanda Napoleon   CHIEF COMPLAINT:     Nausea, vomiting and blood in stool over the past week   HISTORY OF PRESENT ILLNESS   59 y.o. Female with PMHx of ESRD on HD, CHF, CAD s\p stent placement, afib on coumadin, brought to the ER for vomiting over the last week, and not able to keep anything down. She also seen some blood in her stools. She missed dialysis today due to feeling ill. Patient is from [place] and [place] at [facility] dialysis. She was temporarily visiting [place] because of a transplant eval at [facility] yesterday. She presents short of breath. Found to be severely hypotensive and tachycardic to 130-140's, with episodes of afib.  Able to communicate some in short sentences in the ED, but started to have worsening respiratory status and was urgently intubated.  Most information is from husband and daughter, who stated that she as been ill for the past week with nausea and vomiting, poor appetite, they have been trying to urge her to see a physician but she kept refusing.  Yesterday at [facility] clinic, the physician tried to urge her to [facility] ED, given her clinical appearance, but she refused.  Over the last 24 hrs she has not been able to tolerate anything by mouth, and this morning she had a large bright bloody stool movement.  Patient has a L permcath. Her last dialysis was on Thursday  Record Review: [place]: Report of echo for m5/2013; Severe LVH. Restrictive flillin pattern, basal inferior akinesis. Moderate MR Fixed posterior MVL LVEF >54% Hx diastolic CHF  CAD NSTEMI x 3 PCI x 2 to RCA and LCx Last in 2013  PVOD OSA - does not use CPAP Hx colon polyp Hx tobacco abuse HL GERD  Atrial fib S/p DCCV in 04/2010 On coumadin   Significant Events 6/18 - intubated for  respiratory distress and AMS in the ED, ETT 7.5 23cm at the lip 6/18 - permcath removed by Dr. Delana Meyer  Significant Studies CT Head 6/18 - pending CT Chest 6/18 - pending CT Abd/Pelvis 6/18 - pending  PAST MEDICAL HISTORY    :  Past Medical History  Diagnosis Date  . Renal disorder   . Hemodialysis patient   . A-fib   . CHF (congestive heart failure)   . Coronary artery disease   . Myocardial infarction   . Hypertension    Past Surgical History  Procedure Laterality Date  . Cholecystectomy    . Abdominal aortic aneurysm repair  2014   Prior to Admission medications   Medication Sig Start Date End Date Taking? Authorizing Provider  calcium acetate (PHOSLO) 667 MG capsule Take 667 mg by mouth 3 (three) times daily with meals. 09/18/14  Yes Historical Provider, MD  diltiazem (CARDIZEM) 60 MG tablet Take 60 mg by mouth 3 (three) times daily. 08/13/14  Yes Historical Provider, MD  metoprolol (LOPRESSOR) 50 MG tablet Take 50 mg by mouth 2 (two) times daily. 09/18/14  Yes Historical Provider, MD  pantoprazole (PROTONIX) 40 MG tablet Take 40 mg by mouth daily. 09/18/14  Yes Historical Provider, MD   Not on File   FAMILY HISTORY   Family History  Problem Relation Age of Onset  . CAD Mother   . CAD Brother       SOCIAL HISTORY  Per chart review, prior to intubation:  reports that she has been smoking.  She does not have any smokeless tobacco history on file. She reports that she does not drink alcohol or use illicit drugs.  ROS- unable to obtain, patient intubated and sedated.     VITAL SIGNS    Temp:  [97.5 F (36.4 C)] 97.5 F (36.4 C) (06/18 1230) Pulse Rate:  [29-150] 142 (06/18 1238) Resp:  [15-35] 24 (06/18 1246) BP: (34-111)/(17-94) 62/30 mmHg (06/18 1249) SpO2:  [34 %-96 %] 34 % (06/18 1238) FiO2 (%):  [100 %] 100 % (06/18 1340) Weight:  [190 lb (86.183 kg)] 190 lb (86.183 kg) (06/18 0847) HEMODYNAMICS:   VENTILATOR SETTINGS: Vent Mode:  [-] AC FiO2  (%):  [100 %] 100 % Set Rate:  [16 bmp] 16 bmp Vt Set:  [450 mL] 450 mL PEEP:  [5 cmH20] 5 cmH20 INTAKE / OUTPUT: No intake or output data in the 24 hours ending 12/03/2014 1424     PHYSICAL EXAM   Physical Exam  General: Critically ill appearing, on ventilator  HEENT Muddy sclera, moist mucus membranes  Neck: supple, small R neck hematoma  Lungs: Intubated on MV, coarse upper airway sounds, crackles (R>L)  Heart: tachycardic, irregular  Abdomen: Soft, large amount of gastric residuals (>1L, dark, bilious appearing)  Extremities: 2+ edema  Neurologic: Alert, able to answer simple questions  Skin: + edema blisters over lower legs b/l  access Left IJ permcath, rt arm AVF + bruit          LABS   LABS:  CBC  Recent Labs Lab 03-Dec-2014 0904  WBC 8.3  HGB 12.2  HCT 39.5  PLT 47*   Coag's  Recent Labs Lab 12/03/14 0904  APTT 38*  INR 2.45   BMET No results for input(s): NA, K, CL, CO2, BUN, CREATININE, GLUCOSE in the last 168 hours. Electrolytes No results for input(s): CALCIUM, MG, PHOS in the last 168 hours. Sepsis Markers  Recent Labs Lab 2014-12-03 0937  LATICACIDVEN 11.8*   ABG  Recent Labs Lab 12/03/2014 1325  PHART 7.00*  PCO2ART 35  PO2ART 105   Liver Enzymes  Recent Labs Lab 12/03/2014 0904  AST 145*  ALT 61*  ALKPHOS 118  BILITOT 4.2*  ALBUMIN 3.3*   Cardiac Enzymes  Recent Labs Lab 12-03-14 0904  TROPONINI 0.54*   Glucose  Recent Labs Lab Dec 03, 2014 1311 12/03/2014 1318  GLUCAP <10* <10*     No results found for this or any previous visit (from the past 240 hour(s)).   Current facility-administered medications:  .  dextrose 25% IV injection, 25 mg, Intravenous, Once, Thai Burgueno, MD .  dextrose 25% IV injection, 25 mg, Intravenous, Once, Justo Hengel, MD .  norepinephrine (LEVOPHED) 16 mg in dextrose 5 % 250 mL (0.064 mg/mL) infusion, 16 mcg/min, Intravenous, Once, Zaylen Susman, MD .   pantoprazole (PROTONIX) injection 40 mg, 40 mg, Intravenous, Q12H, Aldean Jewett, MD .  piperacillin-tazobactam (ZOSYN) IVPB 3.375 g, 3.375 g, Intravenous, Once, Earleen Newport, MD .  sodium bicarbonate 4.2 % injection 50 mEq, 50 mEq, Intravenous, STAT, Keijuan Schellhase, MD .  sodium bicarbonate injection 50 mEq, 50 mEq, Intravenous, Once, Florene Glen, MD .  vasopressin (PITRESSIN) 40 Units in sodium chloride 0.9 % 250 mL (0.16 Units/mL) infusion, 0.03 Units/min, Intravenous, Continuous, Vilinda Boehringer, MD  IMAGING    Dg Chest 1 View  December 03, 2014   CLINICAL DATA:  Post intubation  EXAM: CHEST  1 VIEW  COMPARISON:  12/07/2014  FINDINGS: Endotracheal tube is appropriately positioned. Left IJ approach hemodialysis catheter terminates over the mid SVC. Marked enlargement of the cardiomediastinal silhouette is noted with increase in diffuse perihilar ground-glass airspace opacities superimposed on interstitial edema. Small pleural effusions are noted. Pacing pad in place.  IMPRESSION: Endotracheal tube appropriately positioned.  Increased perihilar airspace opacities with a pattern most typical for alveolar edema superimposed on interstitial edema.  Marked cardiomegaly.   Electronically Signed   By: Conchita Paris M.D.   On: December 07, 2014 13:05   Dg Chest 1 View  2014/12/07   CLINICAL DATA:  Unsuccessful central line placement.  EXAM: CHEST  1 VIEW  COMPARISON:  Same day.  FINDINGS: Moderate cardiomegaly is noted. Left-sided dialysis catheter is noted with distal tip in the expected position of the SVC. Mild central pulmonary vascular congestion is noted. No pneumothorax or pleural effusion is noted. Bony thorax is intact.  IMPRESSION: Moderate cardiomegaly with mild central pulmonary vascular congestion.   Electronically Signed   By: Marijo Conception, M.D.   On: December 07, 2014 12:04   Dg Abd 1 View  12/07/2014   CLINICAL DATA:  Orogastric tube placement  EXAM: ABDOMEN - 1 VIEW  COMPARISON:  None.   FINDINGS: Orogastric tube tip and side port are in the stomach. There is gastric distention. There is no small bowel are large bowel dilatation. No air-fluid level. No free air. There are multiple surgical clips in the abdomen. There is atherosclerotic change in the aorta.  IMPRESSION: Orogastric tube tip and side port in stomach. Stomach is distended. No smaller large bowel dilatation.   Electronically Signed   By: Lowella Grip III M.D.   On: 12-07-14 13:54   Dg Chest Port 1 View  12-07-2014   CLINICAL DATA:  Hypotension  EXAM: PORTABLE CHEST - 1 VIEW  COMPARISON:  None.  FINDINGS: Severe cardiomegaly. Left internal jugular dialysis catheter tip in the upper SVC. Hazy pulmonary edema throughout both central lungs. No pneumothorax.  IMPRESSION: CHF with central edema.   Electronically Signed   By: Marybelle Killings M.D.   On: 2014-12-07 13:19      Indwelling Urinary Catheter continued, requirement due to   Reason to continue Indwelling Urinary Catheter for strict Intake/Output monitoring for hemodynamic instability   Central Line continued, requirement due to   Reason to continue [employer] Monitoring of central venous pressure or other hemodynamic parameters   Ventilator continued, requirement due to, resp failure    Ventilator Sedation RASS 0 to -2     Lines L Permcath removal - 6/18 by Dr. Delana Meyer Right Fem HD cath - 6/18 by Dr. Delana Meyer CVL- L Fem by Dr. Trilby Drummer - R fem by Dr. Delana Meyer OGT - 6/18 Foley - 6/18   ASSESSMENT/PLAN  59 year old female past medical history of hypertension, end-stage renal disease on hemodialysis, coronary artery disease status post stent placement, atrial fibrillation on Coumadin, with nausea and vomiting, bloody stools for the past few days, admitted to the ICU with respiratory failure and sepsis with concern for possible bowel obstruction.  PULMONARY  A: Acute respiratory Failure Diffuse Alveolar Damage P:   - secondary to hypotension  and sepsis - cont with MV, wean as tolerated - sedation/analgesia - precedex/fentanyl - check abg and repeat cxr - Lactic acid and abg in the AM - CT chest when stable - blood noted in the ETT - ? Early DIC and sepsis driving DAD - started on steroids   CARDIOVASCULAR CVL  A: Hypotension Elevated troponin Afib dCHF sepsis P:  - secondary to sepsis - possible permcath infection, ? Sbo/bowel obstruction - stop dopamine, cont with levophed and vasopressin - maintain MAP>65 - gentle fluid boluses if needed (250cc) - no anticoagulation given large blood bowel movement, and large gastric residual - trend CE, no anticoagulation at this time, concern for GIB - start steroids.   RENAL ESRD Met Acidosis Lactic Acidosis - last HD was on Thursday - electrolytes are pending - will probably need CRRT as she is hypotensive and will require pressors - Avoid excessive fluid bolus if possible - 2 amps bicarb given stat for pH 7.0  GASTROINTESTINAL A:  Nasuea, vomiting, diarrhea, ?bloody stool, ?sbo P:   - high concern for sbo - upon inserting OGT large amount of dark/bilious residuals (1 L) noted.  - check stool occult - monitor H\H  - CT A\P when stable for transport.  - surgical consult.  - GI consult  HEMATOLOGIC A:  Elevated Hemoglobin Low platelets, ? Early DIC Elevated INR P:  - chronic kidney disease - low platelets secondary to sepsis, ? Early dic - monitor labs - cbc q8 x 1 day - was on coumadin for afib - reverse with 2u FFP, given bleeding episode.  - check fibrinogen level  INFECTIOUS A:   Sepsis Possible Permcath infection P:   - suspect sepsis from permcath infection. Blood cultures are pending - WBC count is pending, lactic acid reported to be 11 - per nephro: Dr Delana Meyer to get permcath removed urgently and place a temp cath for further dialysis BCx2 Pending [id number] , start date 2014/11/13  day 1/  ENDOCRINE A:  hypoglycemia  P:   - secondary to  sepsis - got 2 amp of glucose - follow up labs, monitor for hypoglycemia - ICU hypoglycemia protocol  NEUROLOGIC A:  Altered Mental status P:   - secondary to sepsis - f\u CT Head - cont with sedation, will plan for sedation on daily assessments.  RASS goal: -1   CODE STATUS:  FULL CODE - family updated and briefed on overall critical condition and plan of care.    Today summary: Patient is critically ill, with multiorgan system failure, currently her PermCath is of concern for being a source of infection, in addition she has intractable nausea and vomiting with new onset bright blood per rectum since this morning, now with blood noted in the ETT. There is concern for bowel obstruction versus bowel perforation.    I have personally obtained a history, examined the patient, evaluated laboratory and imaging results, formulated the assessment and plan and placed orders.  The Patient requires high complexity decision making for assessment and support, frequent evaluation and titration of therapies, application of advanced monitoring technologies and extensive interpretation of multiple databases. Critical Care Time devoted to patient care services described in this note is 55 minutes.   Overall, patient is critically ill, prognosis is guarded. Patient at high risk for cardiac arrest and death.   Vilinda Boehringer, MD [facility] Pager [phone] (Please enter 7-digits)     11/13/14, 12:50 PM

## 2014-11-23 NOTE — ED Notes (Signed)
Dr Mayford Knife aware of troponin 0.054

## 2014-11-23 NOTE — ED Notes (Signed)
Dr Mayford Knife aware of tropnoin 0.54

## 2014-11-23 NOTE — Op Note (Signed)
  OPERATIVE NOTE   PROCEDURE: 1. Removal of a left IJ tunneled dialysis catheter  PRE-OPERATIVE DIAGNOSIS: Complication of dialysis catheter, End stage renal disease  POST-OPERATIVE DIAGNOSIS: Same  SURGEON: Justus Duerr, Latina Craver, M.D.  ANESTHESIA: Local anesthetic with 1% lidocaine with epinephrine   ESTIMATED BLOOD LOSS: Minimal   FINDING(S): 1. Catheter intact   SPECIMEN(S):  Catheter  INDICATIONS:   Gina Sandoval is a 59 y.o. female who presents with sepsis.  The patient has undergone placement of an extremity access which is working and this has been successfully cannulated without difficulty.  therefore is undergoing removal of his tunneled catheter which is no longer needed to avoid septic complications.   DESCRIPTION: After obtaining full informed written consent, the patient was positioned supine. The left IJ catheter and surrounding area is prepped and draped in a sterile fashion. The cuff was localized by palpation and noted to be less than 3 cm from the exit site. After appropriate timeout is called, 1% lidocaine with epinephrine is infiltrated into the surrounding tissues around the cuff. Small transverse incision is created at the exit site with an 11 blade scalpel and the dissection was carried up along the catheter to expose the cuff of the tunneled catheter.  The catheter cuff is then freed from the surrounding attachments and adhesions. Once the catheter has been freed circumferentially it is removed in 1 piece. Light pressure was held at the base of the neck.   Antibiotic ointment and a sterile dressing is applied to the exit site. Patient tolerated procedure well and there were no complications.  COMPLICATIONS: None  CONDITION: Unchanged  Gina Sandoval, Latina Craver, M.D. [facility] Office: [phone]  2014-11-22,6:00 PM

## 2014-11-23 NOTE — Consult Note (Addendum)
Date: December 02, 2014                  Patient Name:  Gina Sandoval  MRN: [id number]  DOB: [birth year]  Age / Sex: 59 y.o., female         PCP: Pcp Not In System                 Service Requesting Consult: ER, [facility]                 Reason for Consult: ESRD, Hypotension            History of Present Illness: Patient is a 59 y.o. female with medical problems of ESRD started HD in dec 2015, HTN, CAD/MI with h/o coronary stents x 3, CHF, unspec who was admitted to [facility] on 2014/12/02 for evaluation of hypotension tachycardia and sepsis.   Patient is from [place] and dialyzes at [facility] dialysis. She was temporarily visiting [facility] because of a transplant eval at [facility] yesterday. She presents short of breath. Found to be severely hypotensive and tachycardic to 130-140's Able to communicate some in short sentences.  Most information is from husband who is at bedside Patient has a permcath Her last dialysis was on Thursday  Medications: Outpatient medications:  (Not in a [facility] admission)  Current medications: Current Facility-Administered Medications  Medication Dose Route Frequency Provider Last Rate Last Dose  . piperacillin-tazobactam (ZOSYN) IVPB 3.375 g  3.375 g Intravenous Once Gina Filbert, MD      . vancomycin (VANCOCIN) IVPB 1000 mg/200 mL premix  1,000 mg Intravenous Once Gina Filbert, MD       No current outpatient prescriptions on file.      Allergies: Allergies not on file    Past Medical History: Past Medical History  Diagnosis Date  . Renal disorder   . Hemodialysis patient   . A-fib   . CHF (congestive heart failure)      Past Surgical History: Past Surgical History  Procedure Laterality Date  . Cholecystectomy    . Abdominal aortic aneurysm repair  2014     Family History: History reviewed. No pertinent family history.   Social History: History   Social History  . Marital Status: Married    Spouse  Name: N/A  . Number of Children: N/A  . Years of Education: N/A   Occupational History  . Not on file.   Social History Main Topics  . Smoking status: Light Tobacco Smoker  . Smokeless tobacco: Not on file  . Alcohol Use: No  . Drug Use: No  . Sexual Activity: Not on file   Other Topics Concern  . Not on file   Social History Narrative  . No narrative on file   Husband: Gina Sandoval  Review of Systems: unreliable as patient is critically ill No fevers, diarrhea, Kidney stones, or blood in stool reported + Nausea + SOB + leg edema + edema blisters on leg   Vital Signs: Blood pressure 107/44, pulse 135, resp. rate 31, height  (1.753 m), weight 86.183 kg (190 lb).  Weight trends: Filed Weights   12-02-14 0847  Weight: 86.183 kg (190 lb)    Physical Exam: General:  Critically ill appearing, restless  HEENT Muddy sclera, moist mucus membranes  Neck:  supple  Lungs: Diffuse crackles B/L  Heart:  tachycardic, irregular  Abdomen: Soft, non tender  Extremities:  +++ edema  Neurologic: Alert, able to answer simple questions  Skin: + edema blisters over lower legs b/l  access Left IJ permcath, rt arm AVF + bruit          Lab results: Basic Metabolic Panel: No results for input(s): NA, K, CL, CO2, GLUCOSE, BUN, CREATININE, CALCIUM, MG, PHOS in the last 168 hours.  Liver Function Tests: No results for input(s): AST, ALT, ALKPHOS, BILITOT, PROT, ALBUMIN in the last 168 hours. No results for input(s): LIPASE, AMYLASE in the last 168 hours. No results for input(s): AMMONIA in the last 168 hours.  CBC:  Recent Labs Lab 2014/11/17 0904  WBC PENDING  NEUTROABS PENDING  HGB 12.2  HCT 39.5  MCV 95.8  PLT PENDING    Cardiac Enzymes:  Recent Labs Lab 11/17/14 0904  TROPONINI 0.54*    BNP: Invalid input(s): POCBNP  CBG: No results for input(s): GLUCAP in the last 168 hours.  Microbiology: No results found for this or any previous  visit.  Coagulation Studies: No results for input(s): LABPROT, INR in the last 72 hours.  Urinalysis: No results for input(s): COLORURINE, LABSPEC, PHURINE, GLUCOSEU, HGBUR, BILIRUBINUR, KETONESUR, PROTEINUR, UROBILINOGEN, NITRITE, LEUKOCYTESUR in the last 72 hours.  Invalid input(s): APPERANCEUR    Imaging:  No results found.   Assessment & Plan: Pt is a 59 y.o. yo female with a PMHX of medical problems of ESRD started HD in dec 2015, HTN, CAD/MI with h/o coronary stents x 3, CHF, unspec , AAA repair, was admitted to [facility] on 11-17-14 with hypotension tachycardia and sepsis.  1. ESRD - last HD was on Thursday - potassium and electrolytes are pending - will probably need CRRT as she is hypotensive and will require pressors - Avoid excessive fluid bolus if possible 2. Hypotension and tachycardia - suspect sepsis from permcath infection. Blood cultures are pending - WBC count is pending, lactic acid reported to be 11 - Have discussed with [facility] to get permcath removed urgently and place a temp cath for further dialysis - Agree with ICU admission and central line placement 3. Shortness of breath - CXR suggests fluid overload - Tachycardia and arrythmias are probably contributing - Consider BiPAP 4. AOCKD - monitor Hgb level 5. SHPTH - hold binders for now - monitor phos level   Critical care time 10.01-10.45am

## 2014-11-23 NOTE — Progress Notes (Signed)
Patient's heart rate steadily decreased on the monitor. Family at bedside when patient went asystole on cardiac monitor. This RN and Pharmacist, hospital both auscultated for heart tones for a full minute with no heart tones auscultated. Dr. Dema Severin aware that patient has deceased and time of death is 57. Family at bedside with chaplin. Tenna Lacko B

## 2014-11-23 DEATH — deceased

## 2017-02-08 IMAGING — CR DG CHEST 1V
1 series · 1 of 1 positions shown · non-contrast
Comparison: 11/10/2014

CLINICAL DATA: Post intubation

EXAM:
CHEST  1 VIEW

[ap]
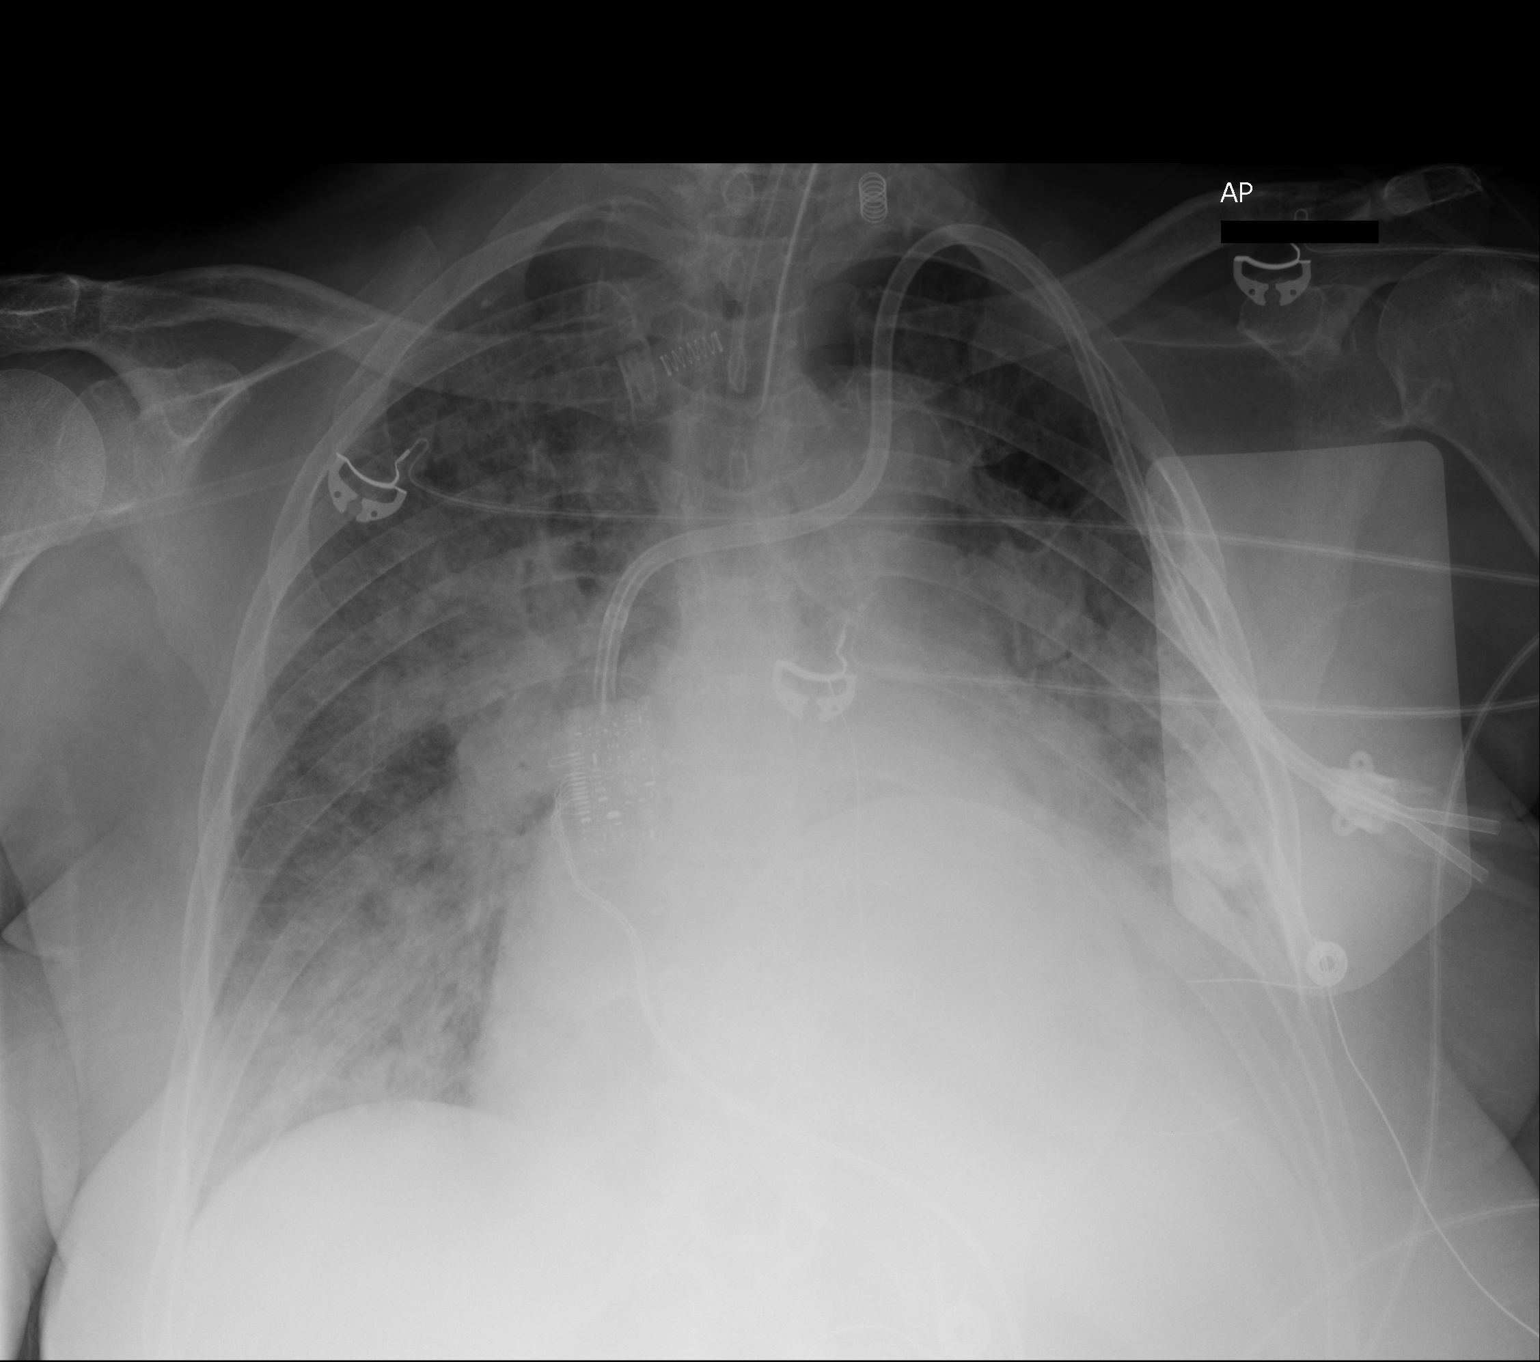

[1 of 1 positions shown; findings below may reference images not displayed]

FINDINGS: Endotracheal tube is appropriately positioned. Left IJ approach
hemodialysis catheter terminates over the mid SVC. Marked
enlargement of the cardiomediastinal silhouette is noted with
increase in diffuse perihilar ground-glass airspace opacities
superimposed on interstitial edema. Small pleural effusions are
noted. Pacing pad in place.
IMPRESSION: Endotracheal tube appropriately positioned.

Increased perihilar airspace opacities with a pattern most typical
for alveolar edema superimposed on interstitial edema.

Marked cardiomegaly.
